# Patient Record
Sex: Female | Born: 1955 | Race: Black or African American | Hispanic: No | Marital: Single | Smoking: Never smoker
Health system: Southern US, Community
[De-identification: ages and names within clinical notes are randomized; demographics above are authoritative.]

## PROBLEM LIST (undated history)

## (undated) DIAGNOSIS — H547 Unspecified visual loss: Secondary | ICD-10-CM

## (undated) DIAGNOSIS — I10 Essential (primary) hypertension: Secondary | ICD-10-CM

## (undated) DIAGNOSIS — K219 Gastro-esophageal reflux disease without esophagitis: Secondary | ICD-10-CM

## (undated) HISTORY — PX: SMALL INTESTINE SURGERY: SHX150

## (undated) HISTORY — DX: Unspecified visual loss: H54.7

## (undated) HISTORY — PX: THYROIDECTOMY: SHX17

## (undated) HISTORY — DX: Gastro-esophageal reflux disease without esophagitis: K21.9

## (undated) HISTORY — DX: Essential (primary) hypertension: I10

---

## 2019-03-30 HISTORY — PX: STOMACH SURGERY: SHX791

## 2020-05-29 ENCOUNTER — Other Ambulatory Visit: Payer: Self-pay

## 2020-05-29 ENCOUNTER — Encounter (HOSPITAL_COMMUNITY): Payer: Self-pay | Admitting: *Deleted

## 2020-05-29 ENCOUNTER — Ambulatory Visit (HOSPITAL_COMMUNITY)
Admission: EM | Admit: 2020-05-29 | Discharge: 2020-05-29 | Disposition: A | Discharge status: Home / Self Care | Payer: Medicaid Other | Attending: Emergency Medicine | Admitting: Emergency Medicine

## 2020-05-29 DIAGNOSIS — I10 Essential (primary) hypertension: Secondary | ICD-10-CM | POA: Diagnosis not present

## 2020-05-29 LAB — CBC WITH DIFFERENTIAL/PLATELET
Abs Immature Granulocytes: 0.01 10*3/uL (ref 0.00–0.07)
Basophils Absolute: 0 10*3/uL (ref 0.0–0.1)
Basophils Relative: 1 %
Eosinophils Absolute: 0 10*3/uL (ref 0.0–0.5)
Eosinophils Relative: 1 %
HCT: 34.4 % — ABNORMAL LOW (ref 36.0–46.0)
Hemoglobin: 11.1 g/dL — ABNORMAL LOW (ref 12.0–15.0)
Immature Granulocytes: 0 %
Lymphocytes Relative: 40 %
Lymphs Abs: 1.6 10*3/uL (ref 0.7–4.0)
MCH: 27.7 pg (ref 26.0–34.0)
MCHC: 32.3 g/dL (ref 30.0–36.0)
MCV: 85.8 fL (ref 80.0–100.0)
Monocytes Absolute: 0.4 10*3/uL (ref 0.1–1.0)
Monocytes Relative: 11 %
Neutro Abs: 1.9 10*3/uL (ref 1.7–7.7)
Neutrophils Relative %: 47 %
Platelets: 190 10*3/uL (ref 150–400)
RBC: 4.01 MIL/uL (ref 3.87–5.11)
RDW: 13.9 % (ref 11.5–15.5)
WBC: 3.9 10*3/uL — ABNORMAL LOW (ref 4.0–10.5)
nRBC: 0 % (ref 0.0–0.2)

## 2020-05-29 LAB — COMPREHENSIVE METABOLIC PANEL
ALT: 10 U/L (ref 0–44)
AST: 22 U/L (ref 15–41)
Albumin: 3.5 g/dL (ref 3.5–5.0)
Alkaline Phosphatase: 89 U/L (ref 38–126)
Anion gap: 11 (ref 5–15)
BUN: 6 mg/dL — ABNORMAL LOW (ref 8–23)
CO2: 25 mmol/L (ref 22–32)
Calcium: 9.3 mg/dL (ref 8.9–10.3)
Chloride: 106 mmol/L (ref 98–111)
Creatinine, Ser: 0.64 mg/dL (ref 0.44–1.00)
GFR, Estimated: >60 mL/min (ref >60)
Glucose, Bld: 89 mg/dL (ref 70–99)
Potassium: 4.1 mmol/L (ref 3.5–5.1)
Sodium: 142 mmol/L (ref 135–145)
Total Bilirubin: 1.1 mg/dL (ref 0.3–1.2)
Total Protein: 6.9 g/dL (ref 6.5–8.1)

## 2020-05-29 MED ORDER — HYDROCHLOROTHIAZIDE 25 MG PO TABS: 25.0000 mg | ORAL_TABLET | Freq: Every day | ORAL | 0 refills | Status: DC

## 2020-05-29 NOTE — Congregational Nurse Program (Signed)
Client came in to establish care with Congregational nurse. New arrival from [place] as refugee. Initial blood pressure 214/95 HR 105. Repeat after 5 minutes 201/103 and 191/115 after resting for 30 minutes.  She is also complaining of vision problems.Not taking any medication at this time.Client speaks Arabic and interpreter was used. A referral to [facility] Urgent care done and Benedetto Goad ride scheduled with [employer]. Arman Bogus RN BSN PCCN  [facility] [phone]-cell [phone]-office

## 2020-05-29 NOTE — ED Provider Notes (Signed)
____________________________________________  Time seen: Approximately 2:56 PM  I have reviewed the triage vital signs and the nursing notes.   HISTORY  Chief Complaint Hypertension   Historian Patient    HPI Emily Newman is a 64 y.o. female presents to the urgent care referred from the congressional nurse program for high blood pressure.  Patient's blood pressure was 167/103 at triage.  Patient has had no other symptoms.  She denies headache, chest pain, chest tightness or abdominal pain.  Patient is accompanied with a family member who translates for patient.  No other alleviating measures have been attempted.  Patient has never been diagnosed with hypertension in the past.   History reviewed. No pertinent past medical history.   Immunizations up to date:  Yes.     History reviewed. No pertinent past medical history.  There are no problems to display for this patient.   History reviewed. No pertinent surgical history.  Prior to Admission medications   Medication Sig Start Date End Date Taking? Authorizing Provider  hydrochlorothiazide (HYDRODIURIL) 25 MG tablet Take 1 tablet (25 mg total) by mouth daily. 05/29/20 06/28/20  Orvil Feil, PA-C    Allergies Patient has no known allergies.  History reviewed. No pertinent family history.  Social History Social History   Tobacco Use  . Smoking status: Never Smoker  . Smokeless tobacco: Never Used  Substance Use Topics  . Alcohol use: Never  . Drug use: Never     Review of Systems  Constitutional: No fever/chills Eyes:  No discharge ENT: No upper respiratory complaints. Respiratory: no cough. No SOB/ use of accessory muscles to breath Gastrointestinal:   No nausea, no vomiting.  No diarrhea.  No constipation. Musculoskeletal: Negative for musculoskeletal pain. Skin: Negative for rash, abrasions, lacerations, ecchymosis.   ____________________________________________   PHYSICAL EXAM:  VITAL  SIGNS: ED Triage Vitals  Enc Vitals Group     BP 05/29/20 1415 (!) 167/103     Pulse Rate 05/29/20 1415 96     Resp 05/29/20 1415 15     Temp 05/29/20 1415 99.4 F (37.4 C)     Temp Source 05/29/20 1415 Oral     SpO2 05/29/20 1415 98 %     Weight --      Height --      Head Circumference --      Peak Flow --      Pain Score 05/29/20 1453 2     Pain Loc --      Pain Edu? --      Excl. in GC? --      Constitutional: Alert and oriented. Well appearing and in no acute distress. Eyes: Conjunctivae are normal. PERRL. EOMI. Head: Atraumatic. ENT:      Nose: No congestion/rhinnorhea.      Mouth/Throat: Mucous membranes are moist.  Neck: No stridor.  No cervical spine tenderness to palpation. Cardiovascular: Normal rate, regular rhythm. Normal S1 and S2.  Good peripheral circulation. Respiratory: Normal respiratory effort without tachypnea or retractions. Lungs CTAB. Good air entry to the bases with no decreased or absent breath sounds Gastrointestinal: Bowel sounds x 4 quadrants. Soft and nontender to palpation. No guarding or rigidity. No distention. Musculoskeletal: Full range of motion to all extremities. No obvious deformities noted Neurologic:  Normal for age. No gross focal neurologic deficits are appreciated.  Skin:  Skin is warm, dry and intact. No rash noted. Psychiatric: Mood and affect are normal for age. Speech and behavior are normal.   ____________________________________________  LABS (all labs ordered are listed, but only abnormal results are displayed)  Labs Reviewed  CBC WITH DIFFERENTIAL/PLATELET  COMPREHENSIVE METABOLIC PANEL   ____________________________________________  EKG   ____________________________________________  RADIOLOGY  No results found.  ____________________________________________    PROCEDURES  Procedure(s) performed:     Procedures     Medications - No data to  display   ____________________________________________   INITIAL IMPRESSION / ASSESSMENT AND PLAN / ED COURSE  Pertinent labs & imaging results that were available during my care of the patient were reviewed by me and considered in my medical decision making (see chart for details).      Assessment and plan Hypertension 64 year old female presents to the urgent care with concern for hypertension.  Patient was hypertensive at triage but vital signs were otherwise reassuring.  Explained to family member that patient needed to establish care with a primary care provider.  I provided a referral a primary care practice during this urgent care encounter.  A CBC and CMP were obtained and patient was started on hydrochlorothiazide.  Patient was contacted by phone and notified that CBC and CMP were reassuring and that she could start hydrochlorothiazide once daily.  Recommended have blood pressured rechecked in 2 to 3 weeks.   ____________________________________________  FINAL CLINICAL IMPRESSION(S) / ED DIAGNOSES  Final diagnoses:  Hypertension, unspecified type      NEW MEDICATIONS STARTED DURING THIS VISIT:  ED Discharge Orders         Ordered    hydrochlorothiazide (HYDRODIURIL) 25 MG tablet  Daily        05/29/20 1449              This chart was dictated using voice recognition software/Dragon. Despite best efforts to proofread, errors can occur which can change the meaning. Any change was purely unintentional.     Orvil Feil, PA-C 05/29/20 1636

## 2020-05-29 NOTE — ED Triage Notes (Signed)
Pt sent from Congregational Nurse Program for HIGH PB. Pt new arrival from [place]. Pt has been in the [place] for 3 weeks. Blood pressure high during initial screening with congregational nurse. BP today. 214/95 - HR 105  201/103 - HR 103  191/115 - HR 103.  Pt is not on any HTN medications.

## 2020-05-29 NOTE — ED Notes (Addendum)
Call daughter [phone] for test results

## 2020-05-29 NOTE — Discharge Instructions (Signed)
Please wait to hear from you by phone before starting medication. If your labs are appropriate, you will take hydrochlorothiazide once daily for the next month and then will reassess blood pressure. Please establish care with a primary care provider.

## 2020-06-05 NOTE — Congregational Nurse Program (Signed)
  Dept: [phone]   Congregational Nurse Program Note  Date of Encounter: 06/05/2020  Past Medical History: No past medical history on file.  Encounter Details:  CNP Questionnaire - 06/05/20 1231      Questionnaire   Do you give verbal consent to treat you today? Yes    Visit Setting Church or Organization    Location Patient Served At [facility]    Patient Status Refugee    Insurance Medicaid    Intervention Advocate;Assess (including screenings);Counsel;Educate;Support    Housing/Utilities Worried about losing Nurse, adult Within past 12 months, worried food would run out with no money to buy more    Referrals PCP - [facility]    ED Visit Averted Yes         Client came in for blood pressure check after ED Visit.Today Blood pressure reading 157/89 HR 91. She is taking medication as advised. Client was referred to [facility] at [facility]. I have scheduled appointment as new patient for December 22 @10 : Loyed Wilmes RN BSN PCCN  [facility] [phone]- cell

## 2020-06-18 ENCOUNTER — Ambulatory Visit (INDEPENDENT_AMBULATORY_CARE_PROVIDER_SITE_OTHER): Payer: Medicaid Other | Admitting: Family

## 2020-06-18 ENCOUNTER — Encounter: Payer: Self-pay | Admitting: Family

## 2020-06-18 ENCOUNTER — Other Ambulatory Visit: Payer: Self-pay

## 2020-06-18 VITALS — BP 153/96 | HR 83 | Ht 62.52 in | Wt 138.8 lb

## 2020-06-18 DIAGNOSIS — Z789 Other specified health status: Secondary | ICD-10-CM

## 2020-06-18 DIAGNOSIS — Z1211 Encounter for screening for malignant neoplasm of colon: Secondary | ICD-10-CM

## 2020-06-18 DIAGNOSIS — Z7689 Persons encountering health services in other specified circumstances: Secondary | ICD-10-CM

## 2020-06-18 DIAGNOSIS — Z2821 Immunization not carried out because of patient refusal: Secondary | ICD-10-CM

## 2020-06-18 DIAGNOSIS — K0889 Other specified disorders of teeth and supporting structures: Secondary | ICD-10-CM

## 2020-06-18 DIAGNOSIS — K219 Gastro-esophageal reflux disease without esophagitis: Secondary | ICD-10-CM | POA: Diagnosis not present

## 2020-06-18 DIAGNOSIS — I10 Essential (primary) hypertension: Secondary | ICD-10-CM | POA: Diagnosis not present

## 2020-06-18 MED ORDER — OMEPRAZOLE 40 MG PO CPDR: 40.0000 mg | DELAYED_RELEASE_CAPSULE | Freq: Every day | ORAL | 0 refills | Status: DC

## 2020-06-18 MED ORDER — HYDROCHLOROTHIAZIDE 25 MG PO TABS: 25.0000 mg | ORAL_TABLET | Freq: Every day | ORAL | 0 refills | Status: DC

## 2020-06-18 NOTE — Progress Notes (Signed)
Patient ID: Emily Newman, female    DOB: 1956-06-08  MRN: [id number]  CC: Establish Care  Subjective: Emily Newman is a 64 y.o. female who presents to establish care.  Current issues and/or concerns: 1. URGENT CARE FOLLOW UP: 05/29/2020: Patient presented to urgent care with concern for hypertension. Patient was hypertensive at triage but vital signs were otherwise reassuring. Explained to patient that she needed to establish care with a primary care provider. A referral provided to primary care practice. CBC and CMP were obtained and patient started on Hydrochlorothiazide. Patient was contacted by phone and notified that CBC and CMP were reassuring and that she could start hydrochlorothiazide once daily. Recommended to have blood pressure rechecked in 2 to 3 weeks.   06/18/2020: Time since discharge: 20 days Hospital/facility: [facility] at [facility]  Diagnosis: hypertension Procedures/tests: CMP, CBC Consultants: none New medications: Hydrochlorothiazide Discharge instructions: return precautions discussed and follow-up with PCP  Status: stable  2. HYPERTENSION FOLLOW-UP: Currently taking: see medication list Have you taken your blood pressure medication today: []  Yes [x]  No  Med Adherence: [x]  Yes    []  No Medication side effects: []  Yes    [x]  No Adherence with salt restriction: [x]  Yes    []  No Exercise: Yes []  No [x]  Home Monitoring?: []  Yes    [x]  No Smoking []  Yes [x]  No SOB? []  Yes    [x]  No Chest Pain?: []  Yes    [x]  No Leg swelling?: []  Yes    [x]  No Headaches?: [x]  Yes, from wisdom teeth  Dizziness? []  Yes    [x]  No  3. HEARTBURN: Reports had abdominal related surgery done around October 2020 in . Can not elaborate on what the procedure was for. Since then having some heartburn which she thinks may be related. Taking over-the-counter Omeprazole 20 mg 1 tablet per day and helps some but would like more relief.  Duration:  chronic Onset:  gradual Radiation: stomach and back Episode duration: all day  Heartburn frequency: daily Alleviatiating factors: Omeprazole Aggravating factors: sour food, soda, milk  Status: worse  Dysphagia: no Odynophagia:  no Nausea: no Vomiting: no Hematemesis: no Blood in stool: no  4. TOOTHACHE: Right upper tooth causing pain. Says a piece of the tooth chipped off and the rest of the tooth is still in the gum and causing discomfort. Would like referral to dentist.  There are no problems to display for this patient.    Current Outpatient Medications on File Prior to Visit  Medication Sig Dispense Refill  . hydrochlorothiazide (HYDRODIURIL) 25 MG tablet Take 1 tablet (25 mg total) by mouth daily. 30 tablet 0   No current facility-administered medications on file prior to visit.    No Known Allergies  Social History   Socioeconomic History  . Marital status: Married    Spouse name: Not on file  . Number of children: Not on file  . Years of education: Not on file  . Highest education level: Not on file  Occupational History  . Not on file  Tobacco Use  . Smoking status: Never Smoker  . Smokeless tobacco: Never Used  Substance and Sexual Activity  . Alcohol use: Never  . Drug use: Never  . Sexual activity: Not Currently  Other Topics Concern  . Not on file  Social History Narrative  . Not on file   Social Determinants of Health   Financial Resource Strain: Not on file  Food Insecurity: Not on file  Transportation  Needs: Not on file  Physical Activity: Not on file  Stress: Not on file  Social Connections: Not on file  Intimate Partner Violence: Not on file    No family history on file.  Past Surgical History:  Procedure Laterality Date  . STOMACH SURGERY  03/2019   completed in [place]     ROS: Review of Systems Negative except as stated above  PHYSICAL EXAM: BP (!) 153/96 (BP Location: Left Arm, Patient Position: Sitting)   Pulse 83   Ht 5' 2.52" (1.588  m)   Wt 138 lb 12.8 oz (63 kg)   SpO2 98%   BMI 24.97 kg/m   Physical Exam Constitutional:      Appearance: Normal appearance.  Eyes:     Extraocular Movements: Extraocular movements intact.     Pupils: Pupils are equal, round, and reactive to light.  Cardiovascular:     Rate and Rhythm: Normal rate and regular rhythm.     Pulses: Normal pulses.     Heart sounds: Normal heart sounds.  Pulmonary:     Effort: Pulmonary effort is normal.     Breath sounds: Normal breath sounds.  Neurological:     General: No focal deficit present.     Mental Status: She is alert and oriented to person, place, and time.  Psychiatric:        Mood and Affect: Mood normal.        Behavior: Behavior normal.    ASSESSMENT AND PLAN: 1. Encounter to establish care: - Patient presents today to establish care.  - Return in 4 to 6 weeks or sooner if needed for annual physical examination, labs, and health maintenance.   2. Essential hypertension: - Blood pressure not at goal during today's visit. Patient asymptomatic without chest pressure, chest pain, palpitations, and shortness of breath. - Patient has not taken blood pressure medication for today yet.  - Continue Hydrochlorothiazide as prescribed. - Follow-up with in 2weeks with clinical pharmacist for blood pressure check. Write down your blood pressure readings each day and bring those results along with your home blood pressure monitor to your appointment. Medications may be adjusted at that time if needed. Counseled patient to take blood pressure medication prior to visit. - Counseled on blood pressure goal of less than 130/80, low-sodium, DASH diet, medication compliance, 150 minutes of moderate intensity exercise per week as tolerated. Discussed medication compliance, adverse effects. - CMP last obtained 05/29/2020. - CBC last obtained 05/29/2020. - Follow-up with primary provider as scheduled. - hydrochlorothiazide (HYDRODIURIL) 25 MG tablet; Take  1 tablet (25 mg total) by mouth daily.  Dispense: 90 tablet; Refill: 0  3. Gastroesophageal reflux disease, unspecified whether esophagitis present: - Patient previously taking over-the-counter Omeprazole 20 mg once daily.  - Increase Omeprazole to 40 mg daily.  - Follow-up with primary provider in 3 months or sooner if needed. - omeprazole (PRILOSEC) 40 MG capsule; Take 1 capsule (40 mg total) by mouth daily.  Dispense: 90 capsule; Refill: 0  4. Toothache: - Patient with right upper tooth pain.  - Per patient request referral to Dentistry for further evaluation and management. - Ambulatory referral to Dentistry  5. Colon cancer screening: - Referral to Gastroenterology for colon cancer screening by colonoscopy.  - Ambulatory referral to Gastroenterology  6. Influenza vaccine refused: - Flu vaccine declined during today's visit.  7. Language barrier: - Patient accompanied by [facility] interpreter. Interpreter name: Bedor. Also, accompanied by family member Adaw who serves as part historian and  part interpreter.    Patient was given the opportunity to ask questions.  Patient verbalized understanding of the plan and was able to repeat key elements of the plan. Patient was given clear instructions to go to Emergency Department or return to medical center if symptoms don't improve, worsen, or new problems develop.The patient verbalized understanding.   Orders Placed This Encounter  Procedures  . Ambulatory referral to Dentistry  . Ambulatory referral to Gastroenterology     Requested Prescriptions   Signed Prescriptions Disp Refills  . omeprazole (PRILOSEC) 40 MG capsule 90 capsule 0    Sig: Take 1 capsule (40 mg total) by mouth daily.    Return in about 6 weeks (around 07/30/2020) for Ricky Stabs, NP and 2 weeks follow-up with Franky Macho .  Rema Fendt, NP

## 2020-06-18 NOTE — Progress Notes (Signed)
Establish care Hospitalization f/u Blurry vision

## 2020-06-18 NOTE — Patient Instructions (Signed)
Return in 4 to 6 weeks or sooner if needed for annual physical examination, labs, and health maintenance.   Continue Hydrochlorothiazide for high blood pressure. Refill on Hydrochlorothiazide. Follow-up in 2 weeks for blood pressure check at the [facility] located at [address] [place], [place], [place] [address].  Referral to Dentistry.  Referral to Gastroenterology.   Omeprazole for acid reflux.  Flu vaccine today.   Tetanus vaccine today. Hypertension, Adult Hypertension is another name for high blood pressure. High blood pressure forces your heart to work harder to pump blood. This can cause problems over time. There are two numbers in a blood pressure reading. There is a top number (systolic) over a bottom number (diastolic). It is best to have a blood pressure that is below 120/80. Healthy choices can help lower your blood pressure, or you may need medicine to help lower it. What are the causes? The cause of this condition is not known. Some conditions may be related to high blood pressure. What increases the risk?  Smoking.  Having type 2 diabetes mellitus, high cholesterol, or both.  Not getting enough exercise or physical activity.  Being overweight.  Having too much fat, sugar, calories, or salt (sodium) in your diet.  Drinking too much alcohol.  Having long-term (chronic) kidney disease.  Having a family history of high blood pressure.  Age. Risk increases with age.  Race. You may be at higher risk if you are African American.  Gender. Men are at higher risk than women before age 50. After age 81, women are at higher risk than men.  Having obstructive sleep apnea.  Stress. What are the signs or symptoms?  High blood pressure may not cause symptoms. Very high blood pressure (hypertensive crisis) may cause: ? Headache. ? Feelings of worry or nervousness (anxiety). ? Shortness of breath. ? Nosebleed. ? A feeling of being sick to your  stomach (nausea). ? Throwing up (vomiting). ? Changes in how you see. ? Very bad chest pain. ? Seizures. How is this treated?  This condition is treated by making healthy lifestyle changes, such as: ? Eating healthy foods. ? Exercising more. ? Drinking less alcohol.  Your health care provider may prescribe medicine if lifestyle changes are not enough to get your blood pressure under control, and if: ? Your top number is above 130. ? Your bottom number is above 80.  Your personal target blood pressure may vary. Follow these instructions at home: Eating and drinking   If told, follow the DASH eating plan. To follow this plan: ? Fill one half of your plate at each meal with fruits and vegetables. ? Fill one fourth of your plate at each meal with whole grains. Whole grains include whole-wheat pasta, brown rice, and whole-grain bread. ? Eat or drink low-fat dairy products, such as skim milk or low-fat yogurt. ? Fill one fourth of your plate at each meal with low-fat (lean) proteins. Low-fat proteins include fish, chicken without skin, eggs, beans, and tofu. ? Avoid fatty meat, cured and processed meat, or chicken with skin. ? Avoid pre-made or processed food.  Eat less than 1,500 mg of salt each day.  Do not drink alcohol if: ? Your doctor tells you not to drink. ? You are pregnant, may be pregnant, or are planning to become pregnant.  If you drink alcohol: ? Limit how much you use to:  0-1 drink a day for women.  0-2 drinks a day for men. ? Be  aware of how much alcohol is in your drink. In the [place] one drink equals one 12 oz bottle of beer (355 mL), one 5 oz glass of wine (148 mL), or one 1 oz glass of hard liquor (44 mL). Lifestyle   Work with your doctor to stay at a healthy weight or to lose weight. Ask your doctor what the best weight is for you.  Get at least 30 minutes of exercise most days of the week. This may include walking, swimming, or biking.  Get at least  30 minutes of exercise that strengthens your muscles (resistance exercise) at least 3 days a week. This may include lifting weights or doing Pilates.  Do not use any products that contain nicotine or tobacco, such as cigarettes, e-cigarettes, and chewing tobacco. If you need help quitting, ask your doctor.  Check your blood pressure at home as told by your doctor.  Keep all follow-up visits as told by your doctor. This is important. Medicines  Take over-the-counter and prescription medicines only as told by your doctor. Follow directions carefully.  Do not skip doses of blood pressure medicine. The medicine does not work as well if you skip doses. Skipping doses also puts you at risk for problems.  Ask your doctor about side effects or reactions to medicines that you should watch for. Contact a doctor if you:  Think you are having a reaction to the medicine you are taking.  Have headaches that keep coming back (recurring).  Feel dizzy.  Have swelling in your ankles.  Have trouble with your vision. Get help right away if you:  Get a very bad headache.  Start to feel mixed up (confused).  Feel weak or numb.  Feel faint.  Have very bad pain in your: ? Chest. ? Belly (abdomen).  Throw up more than once.  Have trouble breathing. Summary  Hypertension is another name for high blood pressure.  High blood pressure forces your heart to work harder to pump blood.  For most people, a normal blood pressure is less than 120/80.  Making healthy choices can help lower blood pressure. If your blood pressure does not get lower with healthy choices, you may need to take medicine. This information is not intended to replace advice given to you by your health care provider. Make sure you discuss any questions you have with your health care provider. Document Revised: 02/23/2018 Document Reviewed: 02/23/2018 Elsevier Patient Education  2020 [employer].

## 2020-06-19 ENCOUNTER — Inpatient Hospital Stay: Payer: Medicaid Other | Admitting: Family

## 2020-07-02 NOTE — Progress Notes (Unsigned)
   S:     PCP:   Patient arrives in good spirits. Presents to the clinic for hypertension evaluation, counseling, and management.  Patient was referred and established care with Primary Care Provider on 06/18/20. At that visit, BP was elevated at 153/96 however pt did not take HCTZ prior to visit. No changes made to HTN medications.  Today, patient reports ***  Compliance? Took meds this morning? When do you take your meds? Dizziness, headaches, blurred vision? History of swelling? Check Clinic BP? Home BP logs? If no logs, bring to next visit w/ BP cuff Go over BP goals Additional BP therapy if needed . Amlodipine 2.5 mg vs 5mg   Diet??  Exercise??    Medication adherence *** .  Current BP Medications include:  HCTZ 25 mg daily  Antihypertensives tried in the past include: none  Dietary habits include: ***  Exercise habits include:***  Family / Social history: *** -Fhx: none -Tobacco: denies  ASCVD risk factors include: none identified   O:   Home BP readings: ***  Last 3 Office BP readings: BP Readings from Last 3 Encounters:  06/18/20 (!) 153/96  06/05/20 (!) 157/89  05/29/20 (!) 167/103    BMET    Component Value Date/Time   NA 142 05/29/2020 1435   K 4.1 05/29/2020 1435   CL 106 05/29/2020 1435   CO2 25 05/29/2020 1435   GLUCOSE 89 05/29/2020 1435   BUN 6 (L) 05/29/2020 1435   CREATININE 0.64 05/29/2020 1435   CALCIUM 9.3 05/29/2020 1435   GFRNONAA >60 05/29/2020 1435    Renal function: CrCl cannot be calculated (Patient's most recent lab result is older than the maximum 21 days allowed.).  Clinical ASCVD: {YES/NO:21197} The ASCVD Risk score 14/06/2019 DC Jr., et al., 2013) failed to calculate for the following reasons:   Cannot find a previous HDL lab   Cannot find a previous total cholesterol lab  PHQ-2 Score: ***   A/P: Hypertension diagnosed *** currently *** on current medications. BP Goal = < *** mmHg. Medication adherence ***.   -{Meds adjust:18428} ***.  -F/u labs ordered - *** -Counseled on lifestyle modifications for blood pressure control including reduced dietary sodium, increased exercise, adequate sleep.  Results reviewed and written information provided.   Total time in face-to-face counseling *** minutes.   F/U Clinic Visit in ***.    2014, PharmD, BCPS PGY2 Ambulatory Care Resident [facility]  Pharmacy

## 2020-07-03 ENCOUNTER — Ambulatory Visit: Payer: Medicaid Other | Admitting: Pharmacist

## 2020-07-16 DIAGNOSIS — Z0289 Encounter for other administrative examinations: Secondary | ICD-10-CM | POA: Insufficient documentation

## 2020-07-17 ENCOUNTER — Ambulatory Visit (INDEPENDENT_AMBULATORY_CARE_PROVIDER_SITE_OTHER): Payer: Medicaid Other | Admitting: Family Medicine

## 2020-07-17 ENCOUNTER — Encounter: Payer: Self-pay | Admitting: Family Medicine

## 2020-07-17 ENCOUNTER — Other Ambulatory Visit: Payer: Self-pay

## 2020-07-17 ENCOUNTER — Encounter: Payer: Self-pay | Admitting: Family

## 2020-07-17 VITALS — BP 132/84 | HR 82 | Ht 65.5 in | Wt 144.8 lb

## 2020-07-17 DIAGNOSIS — E041 Nontoxic single thyroid nodule: Secondary | ICD-10-CM | POA: Diagnosis not present

## 2020-07-17 DIAGNOSIS — R1013 Epigastric pain: Secondary | ICD-10-CM | POA: Diagnosis not present

## 2020-07-17 DIAGNOSIS — R053 Chronic cough: Secondary | ICD-10-CM | POA: Insufficient documentation

## 2020-07-17 DIAGNOSIS — Z0289 Encounter for other administrative examinations: Secondary | ICD-10-CM | POA: Diagnosis not present

## 2020-07-17 DIAGNOSIS — I10 Essential (primary) hypertension: Secondary | ICD-10-CM | POA: Diagnosis not present

## 2020-07-17 DIAGNOSIS — H547 Unspecified visual loss: Secondary | ICD-10-CM | POA: Diagnosis not present

## 2020-07-17 LAB — POCT URINALYSIS DIP (MANUAL ENTRY)
Bilirubin, UA: NEGATIVE
Glucose, UA: NEGATIVE mg/dL
Ketones, POC UA: NEGATIVE mg/dL
Leukocytes, UA: NEGATIVE
Nitrite, UA: NEGATIVE
Protein Ur, POC: NEGATIVE mg/dL
Spec Grav, UA: 1.01 (ref 1.010–1.025)
Urobilinogen, UA: 0.2 E.U./dL
pH, UA: 5.5 (ref 5.0–8.0)

## 2020-07-17 LAB — POCT UA - MICROSCOPIC ONLY

## 2020-07-17 NOTE — Assessment & Plan Note (Signed)
BP at goal, repeat BMP today. Will continue current therapy. Lipid panel for risk stratification.

## 2020-07-17 NOTE — Patient Instructions (Addendum)
It was wonderful to see you today.  Please bring ALL of your medications with you to every visit.   Today we talked about:   I put a referral into the eye doctor, you will be called with an appointmetn   Dental Care  Call one of the numbers below [facility] Dentist  Address: [address], [place], [place] [address] Phone: [phone]  [facility]  Address: [address] [place], [place], [place] [address] Phone: [phone] Thank you for choosing [facility] Family Medicine.   Please call [phone] with any questions about today's appointment.  Please be sure to schedule follow up at the front  desk before you leave today.   [name], MD  Family Medicine

## 2020-07-17 NOTE — Assessment & Plan Note (Signed)
DDx included adhesive disease from prior surgery, reflux, H. Pylori Will discuss further at follow up--consider H. Pylori breath test although negative result invalid as previously Rx'd PPI  Had appropriate pre departure parasite therapy

## 2020-07-17 NOTE — Assessment & Plan Note (Signed)
Referral to [facility]---ask they call grandson ([name]), number in chart.

## 2020-07-17 NOTE — Progress Notes (Signed)
Patient Name: Emily Newman Date of Birth: [birth year] Date of Visit: 07/17/20 PCP: Martyn Malay, MD  Chief Complaint: refugee intake examination and HTN   The patient's preferred language is Arabic. An interpreter was used for the entire visit.  Interpreter Name or ID: Bedor    Subjective: Emily Newman is a pleasant 65 y.o. presenting today for an initial refugee and immigrant clinic visit.   She is joined today by 4 grandchildren--[name] (22), Akut, Achol, and Sara. (14,11,8). She has been in [place] since September. She is caring for kids, [name] is not yet working (hand issue). Feel safe in home and doing well. Have found foods they live, some she can cook. Knows how to use bus route, usually travels with kids due to vision. No specific concerns today.   HTN Taking medication without issue. No headaches, chest pain, or dyspnea.   Vision Impairment  The patient reports a longstanding issue with vision. She is not sure of cause. She is blind in left eye, right eye is 6/60. Denies pain, drainage, recent changes to vision.   Medication Review Reports a history of abdominal pain and dyspepsia. Weight has been stable. Eating and drinking well. She is taking omeprazole, was not tested for H. Pylori prior to Rx. Denies vomiting, melena, hematochezia.  Dental Pain  Patient reports longstanding history of dental issues. She is brushing teeth, desires dental care.     ROS: Negative for headaches, chest pain, dyspnea, abdominal pain    PMH: HTN L eye blindness R eye impairment Poor dentition History of goiter s/p R thyroidectomy on exam  ? Possible history of SBO  PSH: 'stomach surgery'- appears portion of colon removed in 2021 [place]    [place]: None Had 4 children 10 grandchildren   Allergies:  NKDA    Current Medications:  Omeprazole HCTZ 25 mg    Social History: Tobacco Use: denied Alcohol Use: none   Refugee Information Number of Immediate Family Members: 4 Number of  Immediate Family Members in [place]: 4 Date of Arrival: 03/25/20 Country of Birth: [place] Location of Refugee Camp:  ([place]) Duration in [place]: 20 years or greater Reason for Bucks: Membership in particular social group Primary Language: Arabic Marital Status: Single Sexual Activity: No Tuberculosis Screening Overseas: Negative (but abnormal CXR) Tuberculosis Screening Health Department: Not Completed Health Department Labs Completed: No Do You Feel Jumpy or Nervous?: No Are You Very Watchful or 'Super Alert'?: No   Date of Overseas Exam: 02/21/20  Review of Overseas Exam: Yes Pre-Departure Treatment: yes- Ivermectin, Albendazole,  PZQ Overseas Vaccines Reviewed and Updated in Epic Yes   Vitals:   07/17/20 0916  BP: 132/84  Pulse: 82  SpO2: 97%   HEENT: Sclera anicteric. Dentition is poor . Appears well hydrated. Neck: Supple R thyroectomy scar, large L sided thyroid nodule 2 cm in diameter  Cardiac: Regular rate and rhythm. Normal S1/S2. No murmurs, rubs, or gallops appreciated. Lungs: Clear bilaterally to ascultation.  Abdomen: Normoactive bowel sounds. No tenderness to deep or light palpation. No rebound or guarding. Splenomegaly none appreciated  Extremities: Warm, well perfused without edema.  Skin: Warm, dry, no rashes (children present, not full eam)   Psych: Pleasant and appropriate  MSK: Slow but steady normal gait  Ext without edema    Essential hypertension BP at goal, repeat BMP today. Will continue current therapy. Lipid panel for risk stratification.   Thyroid nodule L thyroid nodule, somewhat irregular - TSH today - Will plan  for ultrasound at follow up   Vision impairment Referral to [place] [facility]---ask they call grandson ([name]), number in chart.   Epigastric pain DDx included adhesive disease from prior surgery, reflux, H. Pylori Will discuss further at follow up--consider H. Pylori breath test although negative result invalid as  previously Rx'd PPI  Had appropriate pre departure parasite therapy   HCM Needs colonoscopy, mammogram, Pap (as never had one), PCV23  Also pending HD labs (has visit tomorrow) will need MMR serologies, varicella serologies   I have personally updated the history tabs within Epic and included the refugee information in social documentation.    Designated Advertising account planner signed with agency yes.   Release of information signed for Health Department yes.   Return to care in 1 month in [facility] with resident physician and PCP scheduled.   Vaccines: NA- HD tomorrow   At follow up Thyroid ultrasound Abdominal symptoms    Dorris Singh, MD  [facility] Medicine Teaching Service

## 2020-07-17 NOTE — Assessment & Plan Note (Signed)
L thyroid nodule, somewhat irregular - TSH today - Will plan for ultrasound at follow up

## 2020-07-19 ENCOUNTER — Telehealth: Payer: Self-pay | Admitting: Family Medicine

## 2020-07-19 NOTE — Telephone Encounter (Signed)
Called with interpreter--patient is asleep.   Will try again next week--K on lower side. Will need to discuss decreasing HCTZ vs. K rich foods.  [name], MD  Family Medicine Teaching Service

## 2020-07-21 LAB — CBC WITH DIFFERENTIAL/PLATELET
Basophils Absolute: 0 10*3/uL (ref 0.0–0.2)
Basos: 1 %
EOS (ABSOLUTE): 0.1 10*3/uL (ref 0.0–0.4)
Eos: 2 %
Hematocrit: 34.5 % (ref 34.0–46.6)
Hemoglobin: 11.1 g/dL (ref 11.1–15.9)
Immature Grans (Abs): 0 10*3/uL (ref 0.0–0.1)
Immature Granulocytes: 0 %
Lymphocytes Absolute: 1.3 10*3/uL (ref 0.7–3.1)
Lymphs: 39 %
MCH: 26.7 pg (ref 26.6–33.0)
MCHC: 32.2 g/dL (ref 31.5–35.7)
MCV: 83 fL (ref 79–97)
Monocytes Absolute: 0.4 10*3/uL (ref 0.1–0.9)
Monocytes: 10 %
Neutrophils Absolute: 1.6 10*3/uL (ref 1.4–7.0)
Neutrophils: 48 %
Platelets: 199 10*3/uL (ref 150–450)
RBC: 4.16 x10E6/uL (ref 3.77–5.28)
RDW: 13.1 % (ref 11.7–15.4)
WBC: 3.4 10*3/uL (ref 3.4–10.8)

## 2020-07-21 LAB — COMPREHENSIVE METABOLIC PANEL
ALT: 10 IU/L (ref 0–32)
AST: 23 IU/L (ref 0–40)
Albumin/Globulin Ratio: 1.3 (ref 1.2–2.2)
Albumin: 4.1 g/dL (ref 3.8–4.8)
Alkaline Phosphatase: 113 IU/L (ref 44–121)
BUN/Creatinine Ratio: 12 (ref 12–28)
BUN: 9 mg/dL (ref 8–27)
Bilirubin Total: 0.4 mg/dL (ref 0.0–1.2)
CO2: 25 mmol/L (ref 20–29)
Calcium: 8.9 mg/dL (ref 8.7–10.3)
Chloride: 101 mmol/L (ref 96–106)
Creatinine, Ser: 0.75 mg/dL (ref 0.57–1.00)
GFR calc Af Amer: 97 mL/min/{1.73_m2} (ref >59)
GFR calc non Af Amer: 84 mL/min/{1.73_m2} (ref >59)
Globulin, Total: 3.2 g/dL (ref 1.5–4.5)
Glucose: 119 mg/dL — ABNORMAL HIGH (ref 65–99)
Potassium: 3.2 mmol/L — ABNORMAL LOW (ref 3.5–5.2)
Sodium: 140 mmol/L (ref 134–144)
Total Protein: 7.3 g/dL (ref 6.0–8.5)

## 2020-07-21 LAB — LIPID PANEL
Chol/HDL Ratio: 4.2 ratio (ref 0.0–4.4)
Cholesterol, Total: 184 mg/dL (ref 100–199)
HDL: 44 mg/dL (ref >39)
LDL Chol Calc (NIH): 111 mg/dL — ABNORMAL HIGH (ref 0–99)
Triglycerides: 166 mg/dL — ABNORMAL HIGH (ref 0–149)
VLDL Cholesterol Cal: 29 mg/dL (ref 5–40)

## 2020-07-21 LAB — HIV 1/2 AB DIFFERENTIATION
HIV 1 Ab: NONREACTIVE
HIV 2 Ab: UNDETERMINED

## 2020-07-21 LAB — VARICELLA ZOSTER ANTIBODY, IGG: Varicella zoster IgG: 1330 index (ref >165)

## 2020-07-21 LAB — HEPATITIS B SURFACE ANTIGEN: Hepatitis B Surface Ag: NEGATIVE

## 2020-07-21 LAB — HIV-1/HIV-2 QUALITATIVE RNA
HIV-1 RNA, Qualitative: NONREACTIVE
HIV-2 RNA, Qualitative: NONREACTIVE

## 2020-07-21 LAB — HEPATITIS B CORE ANTIBODY, TOTAL: Hep B Core Total Ab: POSITIVE — AB

## 2020-07-21 LAB — RPR: RPR Ser Ql: NONREACTIVE

## 2020-07-21 LAB — HCV AB W REFLEX TO QUANT PCR: HCV Ab: <0.1 s/co ratio (ref 0.0–0.9)

## 2020-07-21 LAB — HEPATITIS B SURFACE ANTIBODY, QUANTITATIVE: Hepatitis B Surf Ab Quant: 7.3 m[IU]/mL — ABNORMAL LOW (ref >9.9)

## 2020-07-21 LAB — HCV INTERPRETATION

## 2020-07-21 LAB — TSH: TSH: 0.337 u[IU]/mL — ABNORMAL LOW (ref 0.450–4.500)

## 2020-07-21 LAB — HIV ANTIBODY (ROUTINE TESTING W REFLEX): HIV Screen 4th Generation wRfx: REACTIVE

## 2020-07-22 ENCOUNTER — Telehealth: Payer: Self-pay | Admitting: Family Medicine

## 2020-07-22 NOTE — Telephone Encounter (Signed)
   Varicella immune  Hypokalemia

## 2020-07-22 NOTE — Telephone Encounter (Signed)
Called patient and son with interpreter to review labs.  We reviewed hypokalemia, recommended potassium rich diet repeat in 2 weeks.  She is varicella immune.  CBC is improved from prior.  No eosinophilia.  Hep B exposed and antibody titer is low although I suspect she does have some immunity.  Will need a repeat series will need  TSH suppresse repeat with 4 and T3 at follow-up.  All questions answered reviewed the time and date of upcoming appointment.  [place] Arabic interpreter was used throughout the entire entirety of this telephone encounter.

## 2020-07-24 ENCOUNTER — Other Ambulatory Visit: Payer: Self-pay | Admitting: Family Medicine

## 2020-07-24 ENCOUNTER — Telehealth: Payer: Self-pay | Admitting: Family Medicine

## 2020-07-24 NOTE — Telephone Encounter (Signed)
Called with arabic interpreter about lab showing indeterminate HIV 2.   Discussed, will retest Friday.   Will need to discuss R. Busick re: proper labs per [facility].  [name], MD  [facility] Medicine Teaching Service

## 2020-07-26 ENCOUNTER — Other Ambulatory Visit: Payer: Self-pay

## 2020-07-26 ENCOUNTER — Other Ambulatory Visit: Payer: Self-pay | Admitting: Family Medicine

## 2020-07-26 ENCOUNTER — Other Ambulatory Visit: Payer: Medicaid Other

## 2020-07-26 DIAGNOSIS — Z114 Encounter for screening for human immunodeficiency virus [HIV]: Secondary | ICD-10-CM

## 2020-07-28 LAB — HIV-1/HIV-2 QUALITATIVE RNA
HIV-1 RNA, Qualitative: NONREACTIVE
HIV-2 RNA, Qualitative: NONREACTIVE

## 2020-07-30 NOTE — Progress Notes (Signed)
Patient did not show for appointment. °

## 2020-07-31 ENCOUNTER — Encounter: Payer: Medicaid Other | Admitting: Family

## 2020-07-31 DIAGNOSIS — Z0000 Encounter for general adult medical examination without abnormal findings: Secondary | ICD-10-CM

## 2020-07-31 DIAGNOSIS — Z131 Encounter for screening for diabetes mellitus: Secondary | ICD-10-CM

## 2020-07-31 DIAGNOSIS — Z13820 Encounter for screening for osteoporosis: Secondary | ICD-10-CM

## 2020-07-31 DIAGNOSIS — Z13220 Encounter for screening for lipoid disorders: Secondary | ICD-10-CM

## 2020-07-31 DIAGNOSIS — Z1211 Encounter for screening for malignant neoplasm of colon: Secondary | ICD-10-CM

## 2020-07-31 DIAGNOSIS — Z124 Encounter for screening for malignant neoplasm of cervix: Secondary | ICD-10-CM

## 2020-07-31 DIAGNOSIS — Z1231 Encounter for screening mammogram for malignant neoplasm of breast: Secondary | ICD-10-CM

## 2020-08-01 ENCOUNTER — Other Ambulatory Visit: Payer: Self-pay | Admitting: Family Medicine

## 2020-08-01 ENCOUNTER — Telehealth: Payer: Self-pay | Admitting: Family Medicine

## 2020-08-01 LAB — HIV-1/HIV-2 QUALITATIVE RNA
HIV-1 RNA, Qualitative: NONREACTIVE
HIV-2 RNA, Qualitative: NONREACTIVE

## 2020-08-01 LAB — HIV 1/2 AB DIFFERENTIATION
HIV 1 Ab: NONREACTIVE
HIV 2 Ab: UNDETERMINED

## 2020-08-01 LAB — HIV ANTIBODY (ROUTINE TESTING W REFLEX): HIV Screen 4th Generation wRfx: REACTIVE

## 2020-08-01 NOTE — Telephone Encounter (Signed)
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 Patient speaks only [place] and thus communicated through grandson with patient's permission. Repeat HIV2 quant RNA to [facility] at follow up.   [name], MD  Family Medicine Teaching Service

## 2020-08-05 ENCOUNTER — Other Ambulatory Visit: Payer: Self-pay | Admitting: Family Medicine

## 2020-08-05 DIAGNOSIS — R75 Inconclusive laboratory evidence of human immunodeficiency virus [HIV]: Secondary | ICD-10-CM

## 2020-08-05 NOTE — Addendum Note (Signed)
Addended by: Manson Passey, Kailer Heindel on: 08/05/2020 03:49 PM   Modules accepted: Orders

## 2020-08-05 NOTE — Progress Notes (Signed)
Called [facility] [place] [facility] [employer] top tube with at least 3-4 mL of PLASMA sent frozen on dry ice  HIV2VL [address]  [place] [address]   Ordered as future lab for collection.  [name], MD  Family Medicine Teaching Service

## 2020-08-12 ENCOUNTER — Ambulatory Visit (INDEPENDENT_AMBULATORY_CARE_PROVIDER_SITE_OTHER): Payer: Medicaid Other | Admitting: Family Medicine

## 2020-08-12 ENCOUNTER — Encounter: Payer: Self-pay | Admitting: Family Medicine

## 2020-08-12 ENCOUNTER — Other Ambulatory Visit: Payer: Self-pay | Admitting: Family Medicine

## 2020-08-12 ENCOUNTER — Other Ambulatory Visit: Payer: Self-pay

## 2020-08-12 VITALS — BP 146/82 | HR 92 | Ht 66.0 in | Wt 144.0 lb

## 2020-08-12 DIAGNOSIS — E041 Nontoxic single thyroid nodule: Secondary | ICD-10-CM

## 2020-08-12 DIAGNOSIS — E0590 Thyrotoxicosis, unspecified without thyrotoxic crisis or storm: Secondary | ICD-10-CM

## 2020-08-12 DIAGNOSIS — I10 Essential (primary) hypertension: Secondary | ICD-10-CM | POA: Diagnosis not present

## 2020-08-12 DIAGNOSIS — E876 Hypokalemia: Secondary | ICD-10-CM

## 2020-08-12 DIAGNOSIS — H547 Unspecified visual loss: Secondary | ICD-10-CM

## 2020-08-12 DIAGNOSIS — R1013 Epigastric pain: Secondary | ICD-10-CM

## 2020-08-12 DIAGNOSIS — Z659 Problem related to unspecified psychosocial circumstances: Secondary | ICD-10-CM

## 2020-08-12 MED ORDER — AMLODIPINE BESYLATE 5 MG PO TABS: 5.0000 mg | ORAL_TABLET | Freq: Every day | ORAL | 3 refills | Status: DC
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
## 2020-08-12 NOTE — Assessment & Plan Note (Signed)
Number given for [facility]--referral previously placed.

## 2020-08-12 NOTE — Patient Instructions (Addendum)
It was wonderful to see you today.  Please bring ALL of your medications with you to every visit.   Today we talked about:  Starting a new medication for your blood pressure---please pick this up today   Call [facility] [phone]    We are scheduling you ultrasound    Thank you for choosing [facility] Family Medicine.   Please call [phone] with any questions about today's appointment.  Please be sure to schedule follow up at the front  desk before you leave today.   [name], MD  Family Medicine

## 2020-08-12 NOTE — Assessment & Plan Note (Signed)
Improved---will hold off on refilling PPI and transition to H2 blocker in future.

## 2020-08-12 NOTE — Assessment & Plan Note (Signed)
Repeat TSH (depressed last visit) with T4/T3. Ordered and scheduled ultrasound. Perhaps toxic multinodular goiter, hyperfunctioning nodule, no adenopathy on exam but with age must consider malignancy.

## 2020-08-12 NOTE — Assessment & Plan Note (Signed)
Given side effect of hypokalemia, discontinue HCTZ. Start Norvasc. Not at goal, consider losartan at follow up.

## 2020-08-13 ENCOUNTER — Encounter: Payer: Self-pay | Admitting: Family Medicine

## 2020-08-13 LAB — BASIC METABOLIC PANEL
BUN/Creatinine Ratio: 23 (ref 12–28)
BUN: 19 mg/dL (ref 8–27)
CO2: 24 mmol/L (ref 20–29)
Calcium: 9.2 mg/dL (ref 8.7–10.3)
Chloride: 102 mmol/L (ref 96–106)
Creatinine, Ser: 0.83 mg/dL (ref 0.57–1.00)
GFR calc Af Amer: 86 mL/min/{1.73_m2} (ref >59)
GFR calc non Af Amer: 74 mL/min/{1.73_m2} (ref >59)
Glucose: 101 mg/dL — ABNORMAL HIGH (ref 65–99)
Potassium: 3.8 mmol/L (ref 3.5–5.2)
Sodium: 140 mmol/L (ref 134–144)

## 2020-08-13 LAB — TSH: TSH: 0.548 u[IU]/mL (ref 0.450–4.500)

## 2020-08-13 LAB — T4, FREE: Free T4: 1.21 ng/dL (ref 0.82–1.77)

## 2020-08-13 LAB — T3: T3, Total: 105 ng/dL (ref 71–180)

## 2020-08-19 ENCOUNTER — Ambulatory Visit (HOSPITAL_COMMUNITY): Payer: Medicaid Other

## 2020-08-22 ENCOUNTER — Telehealth: Payer: Self-pay | Admitting: Family Medicine

## 2020-08-22 DIAGNOSIS — H539 Unspecified visual disturbance: Secondary | ICD-10-CM

## 2020-08-22 NOTE — Telephone Encounter (Signed)
Refaxed referral that was faxed on 07/19/20. Jone Baseman, CMA

## 2020-08-22 NOTE — Telephone Encounter (Signed)
New referral to [facility] placed.

## 2020-08-23 ENCOUNTER — Telehealth: Payer: Self-pay | Admitting: Family Medicine

## 2020-08-23 NOTE — Telephone Encounter (Signed)
   Telephone encounter was:  Unsuccessful.  08/23/2020 Name: Emily Newman MRN: [id number] DOB: 11-Sep-1955  Unsuccessful outbound call made today to assist with:  Housing Assistance  Outreach Attempt:  1st Attempt  A HIPAA compliant voice message was left requesting a return call.  Instructed patient to call back at [phone]. (Used Landscape architect to call patient)  Danelle Berry Care Guide, Embedded Care Coordination [facility], Care Management Phone: [phone] Email: [email or web address]

## 2020-08-27 ENCOUNTER — Ambulatory Visit (HOSPITAL_COMMUNITY): Payer: Medicaid Other

## 2020-09-03 ENCOUNTER — Telehealth: Payer: Self-pay | Admitting: Family Medicine

## 2020-09-03 NOTE — Telephone Encounter (Signed)
   Telephone encounter was:  Unsuccessful.  09/03/2020 Name: Emily Newman MRN: [id number] DOB: 1956-05-01  Unsuccessful outbound call made today to assist with:  Housing Assistance  Outreach Attempt:  2nd Attempt  Attempted to call patient, but phone is not accepting calls at this time, could not leave a message. Care Guide will give patient a call back within the week.  [facility] Care Guide, Embedded Care Coordination [facility], Care Management Phone: [phone] Email: [email or web address]

## 2020-09-04 ENCOUNTER — Ambulatory Visit (HOSPITAL_COMMUNITY)
Admission: RE | Admit: 2020-09-04 | Discharge: 2020-09-04 | Disposition: A | Discharge status: Home / Self Care | Payer: Medicaid Other | Source: Ambulatory Visit | Attending: Family Medicine | Admitting: Family Medicine

## 2020-09-04 ENCOUNTER — Other Ambulatory Visit: Payer: Self-pay

## 2020-09-04 DIAGNOSIS — E041 Nontoxic single thyroid nodule: Secondary | ICD-10-CM | POA: Insufficient documentation

## 2020-09-04 DIAGNOSIS — E0590 Thyrotoxicosis, unspecified without thyrotoxic crisis or storm: Secondary | ICD-10-CM | POA: Insufficient documentation

## 2020-09-05 ENCOUNTER — Telehealth: Payer: Self-pay | Admitting: Family Medicine

## 2020-09-05 NOTE — Telephone Encounter (Signed)
   Telephone encounter was:  Successful.  09/05/2020 Name: Radley Teston MRN: [id number] DOB: 1955-12-07  Aretta Plamondon is a 65 y.o. year old female who is a primary care patient of Westley Chandler, MD . The community resource team was consulted for assistance with Housing  Care guide performed the following interventions: Spoke with Ms. Rueger today regarding referral. Used [employer] to call patient. Patient stated that she is very grateful for the call because it's been hard trying to get in touch with her sponsor [place]. Patient stated that she also has questions regarding her food stamp card and she would like housing information sent to her in the mail. Informed patient that Care Guide will try to contact [facility] DSS to get her caseworker information and also send her a listing of income based housing. .  Follow Up Plan:  Care guide will follow up with patient by phone over the next week  [facility] Guide, Embedded Care Coordination [facility], Care Management Phone: [phone] Email: [email or web address]

## 2020-09-10 NOTE — Congregational Nurse Program (Signed)
  Dept: [phone]   Congregational Nurse Program Note  Date of Encounter: 09/10/2020  Past Medical History: Past Medical History:  Diagnosis Date  . GERD (gastroesophageal reflux disease)   . Hypertension   . Vision impairment     Encounter Details: Client came in for blood pressure check.She reports taking blood pressure medication as advised. She will be back repeat blood pressure check in a week. She also came in for reading glasses.Reading glasses provided upon request.   Arman Bogus RN BSn [facility]  [facility] Congregational Nurse [phone]-cell [phone]-office

## 2020-09-16 ENCOUNTER — Telehealth: Payer: Self-pay | Admitting: Family Medicine

## 2020-09-16 NOTE — Telephone Encounter (Signed)
Called lab, HIV 2 RNA negative. They will fax results.   Emily Starr, MD  Family Medicine Teaching Service

## 2020-09-17 ENCOUNTER — Ambulatory Visit (INDEPENDENT_AMBULATORY_CARE_PROVIDER_SITE_OTHER): Payer: Medicaid Other | Admitting: Family Medicine

## 2020-09-17 ENCOUNTER — Encounter: Payer: Self-pay | Admitting: Family Medicine

## 2020-09-17 ENCOUNTER — Other Ambulatory Visit: Payer: Self-pay

## 2020-09-17 VITALS — BP 150/84 | HR 88 | Wt 150.8 lb

## 2020-09-17 DIAGNOSIS — R1013 Epigastric pain: Secondary | ICD-10-CM | POA: Diagnosis not present

## 2020-09-17 DIAGNOSIS — M170 Bilateral primary osteoarthritis of knee: Secondary | ICD-10-CM | POA: Diagnosis not present

## 2020-09-17 DIAGNOSIS — I10 Essential (primary) hypertension: Secondary | ICD-10-CM

## 2020-09-17 DIAGNOSIS — E041 Nontoxic single thyroid nodule: Secondary | ICD-10-CM

## 2020-09-17 DIAGNOSIS — Z23 Encounter for immunization: Secondary | ICD-10-CM | POA: Diagnosis not present

## 2020-09-17 LAB — SPECIMEN STATUS REPORT

## 2020-09-17 LAB — OTHER LAB TEST

## 2020-09-17 MED ORDER — DICLOFENAC SODIUM 1.5 % EX SOLN: CUTANEOUS | 3 refills | Status: DC

## 2020-09-17 NOTE — Assessment & Plan Note (Signed)
Discussed at length.  I would like to test her for H. pylori in the future she will have to be off proton pump inhibitor therapy for at least a month.  She will finish these 2 pills and we will not refill this medication.  She is to call if her symptoms worsen.

## 2020-09-17 NOTE — Assessment & Plan Note (Signed)
Repeat exam in March 2023 discussed with patient and her caregiver today.

## 2020-09-17 NOTE — Progress Notes (Signed)
SUBJECTIVE:   CHIEF COMPLAINT / HPI:   The patient speaks [place] Arabic (and [place]) as their primary language.  An interpreter was used for the entire visit.   Emily Newman is a pleasant 65 year old woman with history significant for hypertension and HIV2 indeterminate status.   She presents today with her niece (Emily Newman).  Their major concern is the patient's housing.  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  Her oldest grandson is been drinking alcohol and they would like to move to different housing.  Her referral was previously placed to chronic care management however they have not heard from them.  The patient has difficulty answering her phone as she speaks [place] rather than [place] Arabic.  They are requesting that the social worker contact Emily Newman.   HIV2 lab returned negative at [facility].  The patient is taking her antihypertensive.  She is also taking the rest of the proton pump inhibitor.  She thinks that if she stops taking this her symptoms of reflux will return.  She denies weight loss, melena or hematochezia.  The patient reports intermittent knee and low back pain. Present for many years. She has crepitus. No falls, redness, swelling.   PERTINENT  PMH / PSH/Family/Social History : updated and reviewed  OBJECTIVE:   BP (!) 150/84   Pulse 88   Wt 150 lb 12.8 oz (68.4 kg)   SpO2 98%   BMI 24.34 kg/m   Today's weight:  Last Weight  Most recent update: 09/17/2020  8:58 AM   Weight  68.4 kg (150 lb 12.8 oz)           Review of prior weights: [employer]   09/17/20 0858  Weight: 150 lb 12.8 oz (68.4 kg)     Cardiac: Regular rate and rhythm. Normal S1/S2. No murmurs, rubs, or gallops appreciated. Lungs: Clear bilaterally to ascultation.  Abdomen: Normoactive bowel sounds. No tenderness to deep or light palpation. No rebound or guarding. No epigastric pain  Psych: Pleasant and appropriate  Mild TTP along R medial joint line + crepitus,  good ROM   Thyroid Ultrasound  IMPRESSION: 1.1 cm left inferior TR 4 nodule with macrocalcifications meets criteria for follow-up in 1 year.  Remote right thyroidectomy  Nonspecific background left thyroid heterogeneity suggesting chronic medical thyroid disease.  ASSESSMENT/PLAN:   Essential hypertension Near goal she is taking her medication as prescribed.  Consider increasing to 10 mg versus adding a low-dose of losartan in the future.  Epigastric pain Discussed at length.  I would like to test her for H. pylori in the future she will have to be off proton pump inhibitor therapy for at least a month.  She will finish these 2 pills and we will not refill this medication.  She is to call if her symptoms worsen.  Thyroid nodule Repeat exam in March 2023 discussed with patient and her caregiver today.   Mild bilateral knee pain, suspect OA, less likely meniscal pathology. Trial of NSAID gel (given HTN), consider Xray at follow up.    Social Tana Felts will message chronic care management team and greatly appreciate their support in finding this patient safe housing given her age and social stressors.  Healthcare maintenance Started discussion of Pap smear with patient today will complete at follow-up as she has never had one in past.   Refer to gastroenterology at follow-up for consideration of colonoscopy. Received Hep A, B, and Tdap today.  At follow up PCV20.  Emily Newman, [place]

## 2020-09-17 NOTE — Assessment & Plan Note (Signed)
Near goal she is taking her medication as prescribed.  Consider increasing to 10 mg versus adding a low-dose of losartan in the future.

## 2020-09-17 NOTE — Patient Instructions (Addendum)
It was wonderful to see you today.  Please bring ALL of your medications with you to every visit.   Today we talked about:  [facility]  [address] [place], [place] [address] 01 October 2020 10:10 AM  [phone]   Thank you for choosing [facility] Family Medicine.   Please call [phone] with any questions about today's appointment.  Please be sure to schedule follow up at the front  desk before you leave today.   [name], MD  Family Medicine

## 2020-09-18 ENCOUNTER — Telehealth: Payer: Self-pay | Admitting: Family Medicine

## 2020-09-18 MED ORDER — DICLOFENAC SODIUM 1 % EX GEL: CUTANEOUS | 1 refills | Status: DC

## 2020-09-18 NOTE — Telephone Encounter (Signed)
Alternative voltaren to pharmacy.   [name], MD  Family Medicine Teaching Service

## 2020-09-18 NOTE — Telephone Encounter (Signed)
   Telephone encounter was:  Unsuccessful.  09/18/2020 Name: Emily Newman MRN: [id number] DOB: 09/05/1955  Unsuccessful outbound call made today to assist with:  Housing Assistance  Outreach Attempt:  3rd Attempt.  Referral closed unable to contact patient.  Attempted to contact patient regarding referral. Used [employer], but no Counselling psychologist available at this time. Patient has already been sent resources for housing within the [place] area.   [facility] Care Guide, Embedded Care Coordination [facility], Care Management Phone: [phone] Email: [email or web address]

## 2020-09-21 ENCOUNTER — Other Ambulatory Visit: Payer: Self-pay | Admitting: Family

## 2020-09-21 DIAGNOSIS — K219 Gastro-esophageal reflux disease without esophagitis: Secondary | ICD-10-CM

## 2020-09-23 NOTE — Telephone Encounter (Signed)
Please refill your patients heart burn medication if the request is appropriate.

## 2020-10-01 ENCOUNTER — Other Ambulatory Visit: Payer: Self-pay | Admitting: Family

## 2020-10-01 DIAGNOSIS — K219 Gastro-esophageal reflux disease without esophagitis: Secondary | ICD-10-CM

## 2020-10-29 NOTE — Congregational Nurse Program (Signed)
  Dept: [phone]   Congregational Nurse Program Note  Date of Encounter: 10/29/2020  Past Medical History: Past Medical History:  Diagnosis Date  . GERD (gastroesophageal reflux disease)   . Hypertension   . Vision impairment     Encounter Details:  Patient is requesting assistance to call pharmacy and refill medications. She has been without blood pressure medications for about 2 weeks. Pharmacy contacted and medication refilled. Patient educated on compliance and instructed to ask for assistance before medications ran out.She verbalized understanding.    Arman Bogus RN BSn [facility]  [facility] Congregational Nurse [phone]-cell [phone]-office

## 2020-11-01 ENCOUNTER — Ambulatory Visit: Payer: Medicaid Other | Admitting: Family Medicine

## 2020-12-11 NOTE — Congregational Nurse Program (Signed)
  Dept: [phone]   Congregational Nurse Program Note  Date of Encounter: 12/11/2020  Past Medical History: Past Medical History:  Diagnosis Date   GERD (gastroesophageal reflux disease)    Hypertension    Vision impairment     Encounter Details:  Emily Newman came in for blood pressure check. She is compliant with medication. Inspected her medication ans she has enough for the next 2 weeks. She will be back for blood pressure check and assistance with refill  Arman Bogus RN BSn [facility]  [facility] Congregational Nurse [phone]-cell [phone]-office

## 2020-12-23 ENCOUNTER — Other Ambulatory Visit: Payer: Self-pay | Admitting: Family Medicine

## 2020-12-23 ENCOUNTER — Emergency Department (HOSPITAL_COMMUNITY)
Admission: EM | Admit: 2020-12-23 | Discharge: 2020-12-23 | Disposition: A | Discharge status: Home / Self Care | Payer: Medicaid Other | Attending: Emergency Medicine | Admitting: Emergency Medicine

## 2020-12-23 ENCOUNTER — Emergency Department (HOSPITAL_COMMUNITY): Payer: Medicaid Other

## 2020-12-23 DIAGNOSIS — I10 Essential (primary) hypertension: Secondary | ICD-10-CM | POA: Insufficient documentation

## 2020-12-23 DIAGNOSIS — S0990XA Unspecified injury of head, initial encounter: Secondary | ICD-10-CM | POA: Diagnosis present

## 2020-12-23 DIAGNOSIS — Z59819 Housing instability, housed unspecified: Secondary | ICD-10-CM

## 2020-12-23 DIAGNOSIS — Z79899 Other long term (current) drug therapy: Secondary | ICD-10-CM | POA: Insufficient documentation

## 2020-12-23 DIAGNOSIS — Z20822 Contact with and (suspected) exposure to covid-19: Secondary | ICD-10-CM | POA: Diagnosis not present

## 2020-12-23 DIAGNOSIS — R791 Abnormal coagulation profile: Secondary | ICD-10-CM | POA: Insufficient documentation

## 2020-12-23 DIAGNOSIS — Y900 Blood alcohol level of less than 20 mg/100 ml: Secondary | ICD-10-CM | POA: Diagnosis not present

## 2020-12-23 DIAGNOSIS — T1490XA Injury, unspecified, initial encounter: Secondary | ICD-10-CM

## 2020-12-23 LAB — CBC
HCT: 34.7 % — ABNORMAL LOW (ref 36.0–46.0)
Hemoglobin: 11.1 g/dL — ABNORMAL LOW (ref 12.0–15.0)
MCH: 26.2 pg (ref 26.0–34.0)
MCHC: 32 g/dL (ref 30.0–36.0)
MCV: 81.8 fL (ref 80.0–100.0)
Platelets: 167 10*3/uL (ref 150–400)
RBC: 4.24 MIL/uL (ref 3.87–5.11)
RDW: 14.9 % (ref 11.5–15.5)
WBC: 4.1 10*3/uL (ref 4.0–10.5)
nRBC: 0 % (ref 0.0–0.2)

## 2020-12-23 LAB — COMPREHENSIVE METABOLIC PANEL
ALT: 12 U/L (ref 0–44)
AST: 21 U/L (ref 15–41)
Albumin: 3.3 g/dL — ABNORMAL LOW (ref 3.5–5.0)
Alkaline Phosphatase: 85 U/L (ref 38–126)
Anion gap: 9 (ref 5–15)
BUN: 13 mg/dL (ref 8–23)
CO2: 24 mmol/L (ref 22–32)
Calcium: 8.8 mg/dL — ABNORMAL LOW (ref 8.9–10.3)
Chloride: 106 mmol/L (ref 98–111)
Creatinine, Ser: 0.77 mg/dL (ref 0.44–1.00)
GFR, Estimated: >60 mL/min (ref >60)
Glucose, Bld: 130 mg/dL — ABNORMAL HIGH (ref 70–99)
Potassium: 3 mmol/L — ABNORMAL LOW (ref 3.5–5.1)
Sodium: 139 mmol/L (ref 135–145)
Total Bilirubin: 0.6 mg/dL (ref 0.3–1.2)
Total Protein: 7.4 g/dL (ref 6.5–8.1)

## 2020-12-23 LAB — RESP PANEL BY RT-PCR (FLU A&B, COVID) ARPGX2
Influenza A by PCR: NEGATIVE
Influenza B by PCR: NEGATIVE
SARS Coronavirus 2 by RT PCR: NEGATIVE

## 2020-12-23 LAB — PROTIME-INR
INR: 1 (ref 0.8–1.2)
Prothrombin Time: 13 seconds (ref 11.4–15.2)

## 2020-12-23 LAB — ETHANOL: Alcohol, Ethyl (B): <10 mg/dL (ref <10)

## 2020-12-23 LAB — SAMPLE TO BLOOD BANK

## 2020-12-23 MED ORDER — ACETAMINOPHEN 325 MG PO TABS: 650.0000 mg | ORAL_TABLET | ORAL | Status: DC | PRN

## 2020-12-23 MED ORDER — OLANZAPINE 5 MG PO TBDP: 10.0000 mg | ORAL_TABLET | Freq: Three times a day (TID) | ORAL | Status: DC | PRN

## 2020-12-23 MED ORDER — ONDANSETRON HCL 4 MG PO TABS: 4.0000 mg | ORAL_TABLET | Freq: Three times a day (TID) | ORAL | Status: DC | PRN

## 2020-12-23 MED ORDER — ZIPRASIDONE MESYLATE 20 MG IM SOLR: 20.0000 mg | INTRAMUSCULAR | Status: DC | PRN

## 2020-12-23 MED ORDER — LORAZEPAM 1 MG PO TABS: 1.0000 mg | ORAL_TABLET | ORAL | Status: DC | PRN

## 2020-12-23 NOTE — ED Notes (Signed)
Pt's case worker from the community came to see pt. Pt will be able to be safely discharged with caseworker.

## 2020-12-23 NOTE — ED Notes (Addendum)
Note created on wrong pt.
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
## 2020-12-23 NOTE — BH Assessment (Signed)
Secure chat message sent requesting TTS psych consult set up.

## 2020-12-23 NOTE — ED Notes (Signed)
Family is at bedside at this time. Pt's eyes are not open and she is following simple commands

## 2020-12-23 NOTE — Discharge Instructions (Signed)
???? ??????? ???????               [address] , ?????????, ????????? ???????? [id number]  ???? ?????? ??? ????? ????? ???????.  [phone]

## 2020-12-23 NOTE — ED Notes (Signed)
CT reports approx 15 more minutes until pt goes to CT.
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
## 2020-12-23 NOTE — ED Notes (Signed)
Daughter-in-law called back and reported that no one that she has called will be able to come to the hospital at this time. She stated that she and the son do not have transportation. Encouraged her to use the bus system to come or to continue try to find a way here. Daughter-in-law reports that she will continue to try.

## 2020-12-23 NOTE — ED Notes (Signed)
Contacted family member (daughter-in-law) who spoke with Dr Stevie Kern and this nurse. Encouraged a family member that is trusted by pt to come be with pt at the hospital. Family will consider who is coming. Gave number for family member to call nurse for this pt.
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
## 2020-12-23 NOTE — Progress Notes (Signed)
Called and spoke to niece. She spoke with patient today---patient back at home from ED and safe.Discussed CCM referral for LCSW and follow up re: housing insecurity. Referral placed.   Will message CM Doha (release on file to communicate with her).   [name], MD  Family Medicine Teaching Service

## 2020-12-23 NOTE — Progress Notes (Addendum)
CSW was informed at shift change patient may need assistance with transportation home after TTS evaluation is completed. CSW left taxi voucher at main nurses station for pick up when needed.  CSW spoke with [name], who states she is the patient's case Production designer, theatre/television/film from a nonprofit organization in [place]. Doha states the patient is not in any immediate danger nor is she a risk of harm to herself or anyone else. [name] will provide patient with transportation home.  CSW spoke with at [facility] disposition CSW who states Shavon Rankin, NP will document that there is no need for a TTS evaluation. Dr. Lockie Mola aware and will discharge patient.  Edwin Dada, MSW, LCSW Transitions of Care  Clinical Social Worker II                                                        [phone]

## 2020-12-23 NOTE — ED Notes (Signed)
having difficulty coordinating the [facility] and the TTS at the same time, when 1 is available the other is not

## 2020-12-24 ENCOUNTER — Telehealth: Payer: Self-pay | Admitting: Family Medicine

## 2020-12-24 DIAGNOSIS — I10 Essential (primary) hypertension: Secondary | ICD-10-CM

## 2020-12-24 MED ORDER — AMLODIPINE BESYLATE 5 MG PO TABS: 5.0000 mg | ORAL_TABLET | Freq: Every day | ORAL | 3 refills | Status: DC

## 2020-12-24 NOTE — Telephone Encounter (Addendum)
Called patient's case Production designer, theatre/television/film [name]). Discussed referrals from ED [facility] services).   Emily Newman will call to schedule intake etc. Patient has had difficulty with BP medications--refill to pharmacy. Referral to CCM RN to help with medications and follow up appointments.   Will route to Congregational Nurses for additional recommendations regarding help for family.   [name], MD  Family Medicine Teaching Service
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
## 2020-12-26 ENCOUNTER — Other Ambulatory Visit: Payer: Self-pay

## 2020-12-26 NOTE — Patient Outreach (Signed)
  Medicaid Managed Care Social Work Note  12/26/2020 Name:  Emily Newman MRN:  [id number] DOB:  Jul 12, 1955  Emily Newman is an 65 y.o. year old female who is a primary patient of Westley Chandler, MD.  The Medicaid Managed Care Coordination team was consulted for assistance with:   housing  Emily Newman was given information about Medicaid Managed CareCoordination services today. Emily Newman agreed to services and verbal consent obtained.  Engaged with patient  for by telephone forinitial visit in response to referral for case management and/or care coordination services.   Assessments/Interventions:  Review of past medical history, allergies, medications, health status, including review of consultants reports, laboratory and other test data, was performed as part of comprehensive evaluation and provision of chronic care management services.  SDOH: (Social Determinant of Health) assessments and interventions performed:  BSW contacted patient and left a voicemail BSW also contacted Emily Newman. She states that patient is doing better now that Emily Newman is no longer in the home. Patient would like to get a section 8 voucher. Patient is not currently working right now. BSW will speak with Emily CM Emily Newman to get some additional information.   Advanced Directives Status:  Not addressed in this encounter.  Care Plan                 No Known Allergies  Medications Reviewed Today     Reviewed by Westley Chandler, MD (Physician) on 09/17/20 at 1400  Med List Status: <None>   Medication Order Taking? Sig Documenting Provider Last Dose Status Informant  amLODipine (NORVASC) 5 MG tablet [id number] Yes Take 1 tablet (5 mg total) by mouth at bedtime. Westley Chandler, MD Taking Active   Diclofenac Sodium 1.5 % SOLN [id number] Yes Apply four times daily Westley Chandler, MD  Active   omeprazole (PRILOSEC) 40 MG capsule [id number] Yes Take 1 capsule (40 mg total) by mouth daily. Rema Fendt, NP Taking Active              Patient Active Problem List   Diagnosis Date Noted   Essential hypertension 07/17/2020   Epigastric pain 07/17/2020   Vision impairment 07/17/2020   Thyroid nodule 07/17/2020   Refugee health examination 07/16/2020    Conditions to be addressed/monitored per PCP order:   housing  There are no care plans that you recently modified to display for this patient.   Follow up:  Patient agrees to Care Plan and Follow-up.  Plan: The Managed Medicaid care management team will reach out to the patient again over the next 14 days.  Date/time of next scheduled Social Work care management/care coordination outreach:  patient has an appointment with LCSW on 01/01/21  Gus Puma, BSW, [facility]  [facility]  High Risk Managed Medicaid Team  [phone]

## 2020-12-26 NOTE — Patient Instructions (Signed)
Visit Information  Emily Newman was given information about Medicaid Managed Care team care coordination services as a part of their [facility] Community Plan Medicaid benefit. [name] verbally consented to engagement with the [facility] Managed Care team.   For questions related to your [facility], please call: [phone] or visit the homepage here: [email or web address]  If you would like to schedule transportation through your [facility], please call the following number at least 2 days in advance of your appointment: [phone].   Call the Behavioral Health Crisis Line at [phone], at any time, 24 hours a day, 7 days a week. If you are in danger or need immediate medical attention call 911.  [name] - following are the goals we discussed in your visit today:   Goals Addressed   None      Social Worker will follow up with patient's CM.  Gus Puma, BSW, [place] [facility]  [place]  High Risk Managed Medicaid Team  [phone]   Following is a copy of your plan of care:  There are no care plans to display for this patient.

## 2021-01-01 ENCOUNTER — Other Ambulatory Visit: Payer: Self-pay

## 2021-01-01 NOTE — Patient Instructions (Signed)
Emily Newman ,   The Medicaid Managed Care Team is available to provide assistance to you with your healthcare needs at no cost and as a benefit of your Medicaid Health plan. Please reach out to me at the number below. I am available to be of assistance to you regarding your healthcare needs. .   Thank you,   Tanveer Brammer, BSW, MSW, LCSW Managed Medicaid LCSW [place]  [facility] [email or web address] Phone: [phone]   

## 2021-01-01 NOTE — Patient Outreach (Signed)
  [facility]) Care Management  [facility] Social Work  01/01/2021  Emily Newman Nov 19, 1955 [id number]  Encounter Medications:  Outpatient Encounter Medications as of 01/01/2021  Medication Sig   amLODipine (NORVASC) 5 MG tablet Take 1 tablet (5 mg total) by mouth at bedtime.   diclofenac Sodium (VOLTAREN) 1 % GEL Apply four times daily to knee (Patient taking differently: Apply 2-4 g topically 4 (four) times daily. Apply to  knee)   omeprazole (PRILOSEC) 40 MG capsule Take 1 capsule (40 mg total) by mouth daily.   No facility-administered encounter medications on file as of 01/01/2021.    Functional Status:  In your present state of health, do you have any difficulty performing the following activities: 06/18/2020  Hearing? N  Vision? Y  Difficulty concentrating or making decisions? N  Walking or climbing stairs? Y  Dressing or bathing? N  Doing errands, shopping? N    Fall/Depression Screening:  PHQ 2/9 Scores 09/17/2020 06/18/2020  PHQ - 2 Score 1 2  PHQ- 9 Score 1 3    LCSW completed [facility] outreach attempt today but there were no dinka interpreters available today but was able to reach a [place] Arabic  interpreter to make first outreach. There was no option to leave a voice mail. LCSW will reschedule patient's [facility] Social Work appointment if no return call has been made.    Plan:  Follow-up:  Follow-up in 2-3 week(s)   Dickie La, BSW, MSW, [employer] Managed Medicaid LCSW [facility]  [facility] [email or web address] Phone: [phone]

## 2021-01-14 ENCOUNTER — Ambulatory Visit: Payer: Self-pay

## 2021-01-14 ENCOUNTER — Telehealth: Payer: Self-pay | Admitting: Licensed Clinical Social Worker

## 2021-01-14 NOTE — Patient Instructions (Signed)
Chidera Hitchner ,   The Medicaid Managed Care Team is available to provide assistance to you with your healthcare needs at no cost and as a benefit of your [facility] Health plan. Please reach out to me at the number below. I am available to be of assistance to you regarding your healthcare needs. .   Thank you,   Dickie La, BSW, MSW, LCSW Managed Medicaid LCSW [facility]  [email or web address] Phone: [phone]

## 2021-01-14 NOTE — Patient Outreach (Signed)
  [facility]) Care Management  [facility] Social Work  01/14/2021  Emily Newman May 05, 1956 [id number]  Encounter Medications:  Outpatient Encounter Medications as of 01/14/2021  Medication Sig   amLODipine (NORVASC) 5 MG tablet Take 1 tablet (5 mg total) by mouth at bedtime.   diclofenac Sodium (VOLTAREN) 1 % GEL Apply four times daily to knee (Patient taking differently: Apply 2-4 g topically 4 (four) times daily. Apply to  knee)   omeprazole (PRILOSEC) 40 MG capsule Take 1 capsule (40 mg total) by mouth daily.   No facility-administered encounter medications on file as of 01/14/2021.    Functional Status:  In your present state of health, do you have any difficulty performing the following activities: 06/18/2020  Hearing? N  Vision? Y  Difficulty concentrating or making decisions? N  Walking or climbing stairs? Y  Dressing or bathing? N  Doing errands, shopping? N    Fall/Depression Screening:  PHQ 2/9 Scores 09/17/2020 06/18/2020  PHQ - 2 Score 1 2  PHQ- 9 Score 1 3    Assessment:  Care Plan There are no care plans that you recently modified to display for this patient.    Goals Addressed   None    LCSW completed [facility] second outreach attempt today but there were no dinka interpreters available today. However, [facility] LCSW was able to find a [place] Arabic  interpreter to make second outreach.  LCSW will reschedule patient's [facility] Social Work appointment if no return call has been made.  A HIPPA compliant phone message was left for the patient providing contact information and requesting a return call.   Dickie La, BSW, MSW, [employer] Managed Medicaid LCSW [facility]  [facility] [email or web address] Phone: [phone]

## 2021-01-15 NOTE — Congregational Nurse Program (Signed)
  Dept: [phone]   Congregational Nurse Program Note  Date of Encounter: 01/15/2021  Past Medical History: Past Medical History:  Diagnosis Date   GERD (gastroesophageal reflux disease)    Hypertension    Vision impairment     Encounter Details:   Patient requested blood pressure check. Pressure 142/79 at this time.  Regino Schultze, RN Congregational Nursing

## 2021-01-28 ENCOUNTER — Ambulatory Visit: Payer: Self-pay

## 2021-01-28 ENCOUNTER — Telehealth: Payer: Self-pay | Admitting: Licensed Clinical Social Worker

## 2021-01-28 NOTE — Patient Outreach (Signed)
  [facility]) Care Management  [facility] Social Work  01/28/2021  Seraiah Burgener [birth year] [id number]   Encounter Medications:  Outpatient Encounter Medications as of 01/28/2021  Medication Sig   amLODipine (NORVASC) 5 MG tablet Take 1 tablet (5 mg total) by mouth at bedtime.   diclofenac Sodium (VOLTAREN) 1 % GEL Apply four times daily to knee (Patient taking differently: Apply 2-4 g topically 4 (four) times daily. Apply to  knee)   omeprazole (PRILOSEC) 40 MG capsule Take 1 capsule (40 mg total) by mouth daily.   No facility-administered encounter medications on file as of 01/28/2021.    Functional Status:  In your present state of health, do you have any difficulty performing the following activities: 06/18/2020  Hearing? N  Vision? Y  Difficulty concentrating or making decisions? N  Walking or climbing stairs? Y  Dressing or bathing? N  Doing errands, shopping? N    Fall/Depression Screening:  PHQ 2/9 Scores 09/17/2020 06/18/2020  PHQ - 2 Score 1 2  PHQ- 9 Score 1 3    Assessment:  Care Plan There are no care plans that you recently modified to display for this patient.    Goals Addressed   None   LCSW completed [facility] outreach attempt today but was unable to reach patient successfully. A HIPPA compliant voice message was left encouraging patient to return call once available. LCSW will reschedule patient's [facility] Social Work appointment to 02/05/21 if no return call has been made.  Dickie La, BSW, MSW, [employer] Managed Medicaid LCSW [facility]  [facility] [email or web address] Phone: [phone]

## 2021-02-03 ENCOUNTER — Emergency Department (HOSPITAL_COMMUNITY)
Admission: EM | Admit: 2021-02-03 | Discharge: 2021-02-03 | Disposition: A | Discharge status: Home / Self Care | Payer: Medicaid Other | Attending: Emergency Medicine | Admitting: Emergency Medicine

## 2021-02-03 ENCOUNTER — Emergency Department (HOSPITAL_COMMUNITY): Payer: Medicaid Other

## 2021-02-03 DIAGNOSIS — Z79899 Other long term (current) drug therapy: Secondary | ICD-10-CM | POA: Insufficient documentation

## 2021-02-03 DIAGNOSIS — S61214A Laceration without foreign body of right ring finger without damage to nail, initial encounter: Secondary | ICD-10-CM

## 2021-02-03 DIAGNOSIS — W268XXA Contact with other sharp object(s), not elsewhere classified, initial encounter: Secondary | ICD-10-CM | POA: Insufficient documentation

## 2021-02-03 DIAGNOSIS — Z23 Encounter for immunization: Secondary | ICD-10-CM | POA: Diagnosis not present

## 2021-02-03 DIAGNOSIS — S61216A Laceration without foreign body of right little finger without damage to nail, initial encounter: Secondary | ICD-10-CM | POA: Insufficient documentation

## 2021-02-03 DIAGNOSIS — Y9389 Activity, other specified: Secondary | ICD-10-CM | POA: Insufficient documentation

## 2021-02-03 DIAGNOSIS — I10 Essential (primary) hypertension: Secondary | ICD-10-CM | POA: Diagnosis not present

## 2021-02-03 DIAGNOSIS — S6991XA Unspecified injury of right wrist, hand and finger(s), initial encounter: Secondary | ICD-10-CM | POA: Diagnosis present

## 2021-02-03 MED ORDER — TETANUS-DIPHTH-ACELL PERTUSSIS 5-2.5-18.5 LF-MCG/0.5 IM SUSY
0.5000 mL | PREFILLED_SYRINGE | Freq: Once | INTRAMUSCULAR | Status: AC
  Administered 2021-02-03: 0.5 mL via INTRAMUSCULAR
  Filled 2021-02-03: qty 0.5

## 2021-02-03 MED ORDER — LIDOCAINE HCL (PF) 1 % IJ SOLN
5.0000 mL | Freq: Once | INTRAMUSCULAR | Status: DC
  Filled 2021-02-03: qty 5

## 2021-02-03 NOTE — ED Provider Notes (Signed)
Emergency Medicine Provider Triage Evaluation Note  Emily Newman , a 65 y.o. female  was evaluated in triage.  Pt complains of laceration to R 5th digit that occurred about 1 hour ago. Pt was washing dishes when she cut her hand from a broken dish. Bleeding controlled at this time. Tetanus status unknown. Grandson with patient currently.  Review of Systems  Positive: + laceration to pinky finger Negative: - numbness  Physical Exam  BP (!) 142/113   Pulse 92   Temp 98.7 F (37.1 C) (Oral)   Resp 12   SpO2 99%  Gen:   Awake, no distress   Resp:  Normal effort  MSK:   Moves extremities without difficulty  Other:  2 cm laceration to middle phalanx of right 5th finger, mild bleeding at this time. 2+ radial pulse. Cap refill < 2 seconds.  Medical Decision Making  Medically screening exam initiated at 11:17 AM.  Appropriate orders placed.  Emily Newman was informed that the remainder of the evaluation will be completed by another provider, this initial triage assessment does not replace that evaluation, and the importance of remaining in the ED until their evaluation is complete.  Laceration to right 5th finger. Tetanus updated today. Xray ordered. Will require sutures. There was toilet paper wrapped over wound; attempted to remove in triage with NS flush however still mild debris to finger. Will likely need to soak hand prior to sutures.    Tanda Rockers, PA-C 02/03/21 1121    Gloris Manchester, MD 02/03/21 1950

## 2021-02-03 NOTE — Discharge Instructions (Addendum)
Please follow up with hand surgeon Dr. Melvyn Novas for further evaluation of your finger laceration. He can removed the sutures there. Otherwise the sutures will need to be removed in 10 days time.  While at home you can take Ibuprofen and Tylenol as needed for pain  Return to the ED for any signs of infection including redness/swelling around the wound, drainage of pus, fevers > 100.4, or any other new/concerning symptoms

## 2021-02-03 NOTE — ED Notes (Signed)
Lidocaine at bedside EDP aware.

## 2021-02-03 NOTE — ED Triage Notes (Signed)
Pt here POV with c/o of right finger pain. Pt was washing dishes and cut self. Bleeding controlled, finger wrapped. 10/10 pain

## 2021-02-03 NOTE — ED Provider Notes (Signed)
[facility] EMERGENCY DEPARTMENT Provider Note   CSN: [id number] Arrival date & time: 02/03/21  1101     History Chief Complaint  Patient presents with   Finger Injury    Emily Newman is a 65 y.o. female with PMHx HTN and GERD who presents to the ED today with complaint of finger injury.  Patient was washing dishes when she accidentally sliced her hand on a broken dish in the sink.  Bleeding controlled at time of ED visit.  Tetanus status unknown.  Complains of pain to the area, no other complaints at this time.  Denies any numbness/tingling to the finger.  The history is provided by the patient, medical records and a relative. The history is limited by a language barrier. A language interpreter was used.      Past Medical History:  Diagnosis Date   GERD (gastroesophageal reflux disease)    Hypertension    Vision impairment     Patient Active Problem List   Diagnosis Date Noted   Essential hypertension 07/17/2020   Epigastric pain 07/17/2020   Vision impairment 07/17/2020   Thyroid nodule 07/17/2020   Refugee health examination 07/16/2020    Past Surgical History:  Procedure Laterality Date   SMALL INTESTINE SURGERY     ? SBO in [place] and had bowel resection    STOMACH SURGERY  03/2019   completed in [place]    THYROIDECTOMY     R lobe by exam      OB History   No obstetric history on file.     No family history on file.  Social History   Tobacco Use   Smoking status: Never   Smokeless tobacco: Never  Substance Use Topics   Alcohol use: Never   Drug use: Never    Home Medications Prior to Admission medications   Medication Sig Start Date End Date Taking? Authorizing Provider  amLODipine (NORVASC) 5 MG tablet Take 1 tablet (5 mg total) by mouth at bedtime. 12/24/20   Westley Chandler, MD  diclofenac Sodium (VOLTAREN) 1 % GEL Apply four times daily to knee Patient taking differently: Apply 2-4 g topically 4 (four) times daily. Apply to   knee 09/18/20   Westley Chandler, MD  omeprazole (PRILOSEC) 40 MG capsule Take 1 capsule (40 mg total) by mouth daily. 06/18/20   Rema Fendt, NP    Allergies    Patient has no known allergies.  Review of Systems   Review of Systems  Constitutional:  Negative for fever.  Musculoskeletal:  Positive for arthralgias.  Skin:  Positive for wound.  Neurological:  Negative for numbness.  All other systems reviewed and are negative.  Physical Exam Updated Vital Signs BP (!) 119/51   Pulse 73   Temp 98.7 F (37.1 C) (Oral)   Resp 17   SpO2 100%   Physical Exam Vitals and nursing note reviewed.  Constitutional:      Appearance: She is not ill-appearing.  HENT:     Head: Normocephalic and atraumatic.  Eyes:     Conjunctiva/sclera: Conjunctivae normal.  Cardiovascular:     Rate and Rhythm: Normal rate and regular rhythm.  Pulmonary:     Effort: Pulmonary effort is normal.     Breath sounds: Normal breath sounds.  Musculoskeletal:     Comments: 2 cm laceration along the palmar aspect of middle phalanx of the R 5th finger; bleeding controlled. ROM intact to MCP, PIP, and DIP joint. Cap refill < 2 seconds.  2+ radial pulse.   Skin:    General: Skin is warm and dry.     Coloration: Skin is not jaundiced.  Neurological:     Mental Status: She is alert.    ED Results / Procedures / Treatments   Labs (all labs ordered are listed, but only abnormal results are displayed) Labs Reviewed - No data to display  EKG None  Radiology DG Finger Little Right  Result Date: 02/03/2021 CLINICAL DATA:  Laceration of the fifth finger. EXAM: RIGHT LITTLE FINGER 2+V COMPARISON:  None. FINDINGS: Soft tissue deformity but no evidence of fracture or radiopaque foreign object. IMPRESSION: No fracture or visible foreign object. Electronically Signed   By: Paulina Fusi M.D.   On: 02/03/2021 12:23    Procedures .Marland KitchenLaceration Repair  Date/Time: 02/03/2021 5:25 PM Performed by: Tanda Rockers,  PA-C Authorized by: Tanda Rockers, PA-C   Consent:    Consent obtained:  Verbal   Consent given by:  Patient   Risks discussed:  Infection and pain Universal protocol:    Patient identity confirmed:  Verbally with patient Anesthesia:    Anesthesia method:  Nerve block   Block needle gauge:  25 G   Block anesthetic:  Lidocaine 1% w/o epi   Block injection procedure:  Anatomic landmarks identified   Block outcome:  Anesthesia achieved Laceration details:    Location:  Finger   Finger location:  R small finger   Length (cm):  2   Depth (mm):  3 Pre-procedure details:    Preparation:  Patient was prepped and draped in usual sterile fashion Treatment:    Area cleansed with:  Povidone-iodine   Irrigation solution:  Sterile saline Skin repair:    Repair method:  Sutures   Suture size:  4-0   Wound skin closure material used: vicryl rapide.   Suture technique:  Simple interrupted   Number of sutures:  7 Approximation:    Approximation:  Close Repair type:    Repair type:  Intermediate Post-procedure details:    Dressing:  Non-adherent dressing   Procedure completion:  Tolerated well, no immediate complications   Medications Ordered in ED Medications  lidocaine (PF) (XYLOCAINE) 1 % injection 5 mL (has no administration in time range)  Tdap (BOOSTRIX) injection 0.5 mL (0.5 mLs Intramuscular Given 02/03/21 1640)    ED Course  I have reviewed the triage vital signs and the nursing notes.  Pertinent labs & imaging results that were available during my care of the patient were reviewed by me and considered in my medical decision making (see chart for details).    MDM Rules/Calculators/A&P                           65 year old Arabic speaking female presents to the ED today after sustaining a laceration to her right fifth digit earlier today.  Tetanus status unknown.  X-ray without any signs of bony abnormality or foreign body.  Tetanus has been updated in the ED today.  On  exam she has a 2 cm laceration and is neurovascularly intact.  Sutures placed without difficulty.  Patient advised to follow-up with hand surgery for suture removal.  Strict return precautions been discussed with patient.  She is in agreement with plan and stable for discharge.   This note was prepared using Dragon voice recognition software and may include unintentional dictation errors due to the inherent limitations of voice recognition software.   Final Clinical Impression(s) / ED  Diagnoses Final diagnoses:  Laceration of right ring finger without foreign body without damage to nail, initial encounter    Rx / DC Orders ED Discharge Orders     None        Tanda Rockers, PA-C 02/03/21 1730    Gloris Manchester, MD 02/03/21 2000

## 2021-02-05 ENCOUNTER — Ambulatory Visit: Payer: Self-pay

## 2021-02-05 ENCOUNTER — Telehealth: Payer: Self-pay | Admitting: Licensed Clinical Social Worker

## 2021-02-05 NOTE — Patient Instructions (Signed)
Noel Mcelhiney ,   The Medicaid Managed Care Team is available to provide assistance to you with your healthcare needs at no cost and as a benefit of your Medicaid Health plan. Please reach out to me at the number below. I am available to be of assistance to you regarding your healthcare needs. .   Thank you,   Anneta Rounds, BSW, MSW, LCSW Managed Medicaid LCSW [place]  [facility] [email or web address] Phone: [phone]   

## 2021-02-05 NOTE — Patient Outreach (Signed)
  [facility]) Care Management  [facility] Social Work  02/05/2021  Emily Newman 1956/04/18 [id number]  Encounter Medications:  Outpatient Encounter Medications as of 02/05/2021  Medication Sig   amLODipine (NORVASC) 5 MG tablet Take 1 tablet (5 mg total) by mouth at bedtime.   diclofenac Sodium (VOLTAREN) 1 % GEL Apply four times daily to knee (Patient taking differently: Apply 2-4 g topically 4 (four) times daily. Apply to  knee)   omeprazole (PRILOSEC) 40 MG capsule Take 1 capsule (40 mg total) by mouth daily.   No facility-administered encounter medications on file as of 02/05/2021.    Functional Status:  In your present state of health, do you have any difficulty performing the following activities: 06/18/2020  Hearing? N  Vision? Y  Difficulty concentrating or making decisions? N  Walking or climbing stairs? Y  Dressing or bathing? N  Doing errands, shopping? N    Fall/Depression Screening:  PHQ 2/9 Scores 09/17/2020 06/18/2020  PHQ - 2 Score 1 2  PHQ- 9 Score 1 3    Assessment:  Care Plan There are no care plans that you recently modified to display for this patient.    Goals Addressed   None     LCSW was unable to complete additional outreach to patient on 02/05/21 as there were no dinka interpreters available today which is patient's preferred language. [facility] LCSW will reschedule patient's [facility] Social Work appointment as this specific interpreter is needed as patient has had language barriers in the past with arabic and sudanese interpreters.   Plan:  Follow-up:  Follow-up in 14 day(s)  Dickie La, BSW, MSW, LCSW Managed Medicaid LCSW [facility]  [facility] [email or web address] Phone: [phone]

## 2021-02-07 ENCOUNTER — Encounter: Payer: Medicaid Other | Admitting: Family Medicine

## 2021-02-14 ENCOUNTER — Other Ambulatory Visit: Payer: Self-pay

## 2021-02-14 ENCOUNTER — Encounter: Payer: Self-pay | Admitting: Family Medicine

## 2021-02-14 ENCOUNTER — Ambulatory Visit (INDEPENDENT_AMBULATORY_CARE_PROVIDER_SITE_OTHER): Payer: Medicaid Other | Admitting: Family Medicine

## 2021-02-14 VITALS — BP 110/62 | HR 81 | Wt 138.6 lb

## 2021-02-14 DIAGNOSIS — S61217D Laceration without foreign body of left little finger without damage to nail, subsequent encounter: Secondary | ICD-10-CM

## 2021-02-14 DIAGNOSIS — I10 Essential (primary) hypertension: Secondary | ICD-10-CM | POA: Diagnosis not present
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
## 2021-02-14 NOTE — Patient Instructions (Addendum)
It was wonderful to see you today.  Please bring ALL of your medications with you to every visit.   Today we talked about:  - Going to [facility] TODAY   [address], [place], [place] [address]  IT IS VERY VERY IMPORTANT YOU HAVE YOUR SUTURES REMOVED   Please continue your blood pressure pill  Follow up in  October      Thank you for choosing [facility] Family Medicine.   Please call [phone] with any questions about today's appointment.  Please be sure to schedule follow up at the front  desk before you leave today.   [name], MD  Family Medicine

## 2021-02-14 NOTE — Assessment & Plan Note (Signed)
Markedly improved on repeat and asymptomatic continue current therapy.

## 2021-02-15 LAB — BASIC METABOLIC PANEL
BUN/Creatinine Ratio: 14 (ref 12–28)
BUN: 11 mg/dL (ref 8–27)
CO2: 23 mmol/L (ref 20–29)
Calcium: 9.1 mg/dL (ref 8.7–10.3)
Chloride: 103 mmol/L (ref 96–106)
Creatinine, Ser: 0.77 mg/dL (ref 0.57–1.00)
Glucose: 98 mg/dL (ref 65–99)
Potassium: 4 mmol/L (ref 3.5–5.2)
Sodium: 140 mmol/L (ref 134–144)
eGFR: 86 mL/min/{1.73_m2} (ref >59)

## 2021-02-17 ENCOUNTER — Telehealth: Payer: Self-pay | Admitting: Family Medicine

## 2021-02-17 NOTE — Telephone Encounter (Signed)
Called [facility] with results. BMP normal. They have seen Ortho--and have follow up.   Discussed upcoming visit in refugee clinic for HCM items.  [name], MD  Family Medicine Teaching Service

## 2021-02-18 ENCOUNTER — Ambulatory Visit: Payer: Self-pay

## 2021-02-18 ENCOUNTER — Telehealth: Payer: Self-pay | Admitting: Licensed Clinical Social Worker

## 2021-02-18 NOTE — Patient Outreach (Signed)
  [facility]) Care Management  [facility] Social Work  02/18/2021  Meliya Gillean 28-Aug-1955 [id number]   Encounter Medications:  Outpatient Encounter Medications as of 02/18/2021  Medication Sig   amLODipine (NORVASC) 5 MG tablet Take 1 tablet (5 mg total) by mouth at bedtime.   diclofenac Sodium (VOLTAREN) 1 % GEL Apply four times daily to knee (Patient taking differently: Apply 2-4 g topically 4 (four) times daily. Apply to  knee)   omeprazole (PRILOSEC) 40 MG capsule Take 1 capsule (40 mg total) by mouth daily.   No facility-administered encounter medications on file as of 02/18/2021.    Functional Status:  In your present state of health, do you have any difficulty performing the following activities: 06/18/2020  Hearing? N  Vision? Y  Difficulty concentrating or making decisions? N  Walking or climbing stairs? Y  Dressing or bathing? N  Doing errands, shopping? N    Fall/Depression Screening:  PHQ 2/9 Scores 09/17/2020 06/18/2020  PHQ - 2 Score 1 2  PHQ- 9 Score 1 3    Assessment:  Care Plan There are no care plans that you recently modified to display for this patient.    Goals Addressed   None     LCSW completed [facility] final outreach attempt today but was unable to reach patient successfully.[facility] LCSW will close referral on 02/18/21 if no return call is made by patient.    LCSW has been unable to make contact with the patient for follow up. The [facility] care management team SW is available to follow up with the patient after provider conversation with the patient regarding recommendation for care management engagement and subsequent re-referral to the care management team.   Dickie La, BSW, MSW, LCSW Managed Medicaid LCSW [facility]  [facility] [email or web address] Phone: [phone]

## 2021-02-18 NOTE — Patient Instructions (Signed)
Emily Newman ,   The Medicaid Managed Care Team is available to provide assistance to you with your healthcare needs at no cost and as a benefit of your [facility] Health plan. We have been unable to reach you on 3 separate attempts. The care management team is available to assist with your healthcare needs at any time. Please do not hesitate to contact me at the number below. .   Thank you,   Dickie La, BSW, MSW, LCSW Managed Medicaid LCSW [facility]  [email or web address] Phone: [phone]

## 2021-02-20 ENCOUNTER — Ambulatory Visit: Payer: Medicaid Other | Admitting: Orthopaedic Surgery

## 2021-03-11 ENCOUNTER — Ambulatory Visit: Payer: Medicaid Other

## 2021-03-15 ENCOUNTER — Other Ambulatory Visit: Payer: Self-pay | Admitting: Family

## 2021-03-15 DIAGNOSIS — K219 Gastro-esophageal reflux disease without esophagitis: Secondary | ICD-10-CM

## 2021-03-28 ENCOUNTER — Ambulatory Visit (INDEPENDENT_AMBULATORY_CARE_PROVIDER_SITE_OTHER): Payer: Medicaid Other | Admitting: Family Medicine

## 2021-03-28 ENCOUNTER — Other Ambulatory Visit: Payer: Self-pay

## 2021-03-28 ENCOUNTER — Encounter: Payer: Self-pay | Admitting: Family Medicine

## 2021-03-28 VITALS — BP 142/85 | HR 95 | Wt 140.4 lb

## 2021-03-28 DIAGNOSIS — I10 Essential (primary) hypertension: Secondary | ICD-10-CM

## 2021-03-28 DIAGNOSIS — G8929 Other chronic pain: Secondary | ICD-10-CM

## 2021-03-28 DIAGNOSIS — Z1239 Encounter for other screening for malignant neoplasm of breast: Secondary | ICD-10-CM

## 2021-03-28 DIAGNOSIS — M25561 Pain in right knee: Secondary | ICD-10-CM | POA: Diagnosis not present

## 2021-03-28 DIAGNOSIS — R1013 Epigastric pain: Secondary | ICD-10-CM | POA: Diagnosis not present

## 2021-03-28 DIAGNOSIS — M545 Low back pain, unspecified: Secondary | ICD-10-CM | POA: Diagnosis not present

## 2021-03-28 MED ORDER — AMLODIPINE BESYLATE 5 MG PO TABS: 10.0000 mg | ORAL_TABLET | Freq: Every day | ORAL | 3 refills | Status: DC

## 2021-03-28 NOTE — Patient Instructions (Signed)
It was wonderful to see you today.  Please bring ALL of your medications with you to every visit.   Today we talked about:   -Taking 2 tablets of your blood pressure pill     -Taking 2 tablets of Tylenol 2 times per day   --Getting x-rays of your back and knee    An x-ray was ordered for you---you do not need an appointment to have this completed.  I recommend going to [facility] [address] [place] [place] [address] [place]   If the results are normal,I will send you a letter  I will call you with results if anything is abnormal    Thank you for choosing [facility] Medicine.   Please call [phone] with any questions about today's appointment.  Please be sure to schedule follow up at the front  desk before you leave today.   [name], MD  Family Medicine

## 2021-03-28 NOTE — Assessment & Plan Note (Signed)
Differential includes reflux, functional dyspepsia or H. pylori.  She has been off of PPI therapy will send H. pylori breath test.  If this is negative we will refill PPI.  Otherwise will need quadruple therapy.  She has never previously received treatment.

## 2021-03-28 NOTE — Assessment & Plan Note (Signed)
Blood pressure continues to be above goal.  No symptoms of elevated blood pressure.  Increased amlodipine to 10 mg.  Follow-up in 1 month refugee clinic for check.  Reviewed precautions with family and patient about orthostasis.
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
## 2021-03-29 LAB — H. PYLORI BREATH TEST: H pylori Breath Test: NEGATIVE

## 2021-03-30 ENCOUNTER — Encounter: Payer: Self-pay | Admitting: Family Medicine

## 2021-03-31 ENCOUNTER — Ambulatory Visit: Payer: Medicaid Other | Admitting: Family Medicine

## 2021-04-02 ENCOUNTER — Telehealth: Payer: Self-pay | Admitting: Family Medicine

## 2021-04-02 DIAGNOSIS — K219 Gastro-esophageal reflux disease without esophagitis: Secondary | ICD-10-CM

## 2021-04-02 MED ORDER — OMEPRAZOLE 40 MG PO CPDR: 40.0000 mg | DELAYED_RELEASE_CAPSULE | Freq: Every day | ORAL | 0 refills | Status: DC

## 2021-04-02 NOTE — Telephone Encounter (Signed)
H. pylori is negative.  Given intermittent dyspepsia without red flags we will refill PPI.  Consider referral to gastroenterology at follow-up.  I attempted to call her niece about this.  Left generic voicemail to call back.

## 2021-04-03 ENCOUNTER — Ambulatory Visit
Admission: RE | Admit: 2021-04-03 | Discharge: 2021-04-03 | Disposition: A | Discharge status: Home / Self Care | Payer: Medicaid Other | Source: Ambulatory Visit | Attending: Family Medicine | Admitting: Family Medicine

## 2021-04-03 DIAGNOSIS — G8929 Other chronic pain: Secondary | ICD-10-CM

## 2021-04-05 ENCOUNTER — Encounter: Payer: Self-pay | Admitting: Family Medicine

## 2021-04-05 ENCOUNTER — Telehealth: Payer: Self-pay | Admitting: Family Medicine

## 2021-04-05 DIAGNOSIS — M1711 Unilateral primary osteoarthritis, right knee: Secondary | ICD-10-CM | POA: Insufficient documentation

## 2021-04-05 NOTE — Telephone Encounter (Signed)
Attempted to call niece, [name]. Left generic voicemail. Will send letter. X-ray shows only arthritis.  Consider steroid injection at follow up  [name], MD  [facility] Medicine Teaching Service

## 2021-05-06 ENCOUNTER — Ambulatory Visit: Payer: Medicaid Other

## 2021-05-15 ENCOUNTER — Ambulatory Visit (INDEPENDENT_AMBULATORY_CARE_PROVIDER_SITE_OTHER): Payer: Medicaid Other

## 2021-05-15 ENCOUNTER — Other Ambulatory Visit: Payer: Self-pay

## 2021-05-15 DIAGNOSIS — Z23 Encounter for immunization: Secondary | ICD-10-CM | POA: Diagnosis present

## 2021-05-15 NOTE — Progress Notes (Signed)
Patient presents to nurse clinic for flu vaccination. Patient also has questions regarding Tdap vaccination. Delayed Tdap as patient received last dose on 02/03/2021. Patient has follow up with Dr. Manson Passey on Tuesday, 11/22 and will further discuss at this visit.   Administered flu vaccination in RD, site unremarkable, tolerated injection well.   Veronda Prude, RN

## 2021-05-20 ENCOUNTER — Other Ambulatory Visit: Payer: Self-pay

## 2021-05-20 ENCOUNTER — Ambulatory Visit (INDEPENDENT_AMBULATORY_CARE_PROVIDER_SITE_OTHER): Payer: Medicaid Other | Admitting: Family Medicine

## 2021-05-20 ENCOUNTER — Ambulatory Visit (INDEPENDENT_AMBULATORY_CARE_PROVIDER_SITE_OTHER): Payer: Medicaid Other

## 2021-05-20 VITALS — BP 143/79 | HR 70 | Ht 66.0 in | Wt 141.5 lb

## 2021-05-20 DIAGNOSIS — Z23 Encounter for immunization: Secondary | ICD-10-CM

## 2021-05-20 DIAGNOSIS — L259 Unspecified contact dermatitis, unspecified cause: Secondary | ICD-10-CM

## 2021-05-20 DIAGNOSIS — I10 Essential (primary) hypertension: Secondary | ICD-10-CM

## 2021-05-20 DIAGNOSIS — Z59819 Housing instability, housed unspecified: Secondary | ICD-10-CM | POA: Diagnosis not present

## 2021-05-20 MED ORDER — AMLODIPINE BESYLATE 5 MG PO TABS: 5.0000 mg | ORAL_TABLET | Freq: Every day | ORAL | 3 refills | Status: DC

## 2021-05-20 MED ORDER — TRIAMCINOLONE ACETONIDE 0.5 % EX OINT: 1.0000 "application " | TOPICAL_OINTMENT | Freq: Every day | CUTANEOUS | 0 refills | Status: DC

## 2021-05-20 MED ORDER — SHINGRIX 50 MCG/0.5ML IM SUSR: 0.5000 mL | INTRAMUSCULAR | 0 refills | Status: AC

## 2021-05-20 NOTE — Progress Notes (Signed)
    SUBJECTIVE:   CHIEF COMPLAINT:BP check  HPI:   Emily Newman is a 65 y.o. yo with history notable for hypertension  presenting for medication check.  The patient speaks [place] as their primary language.  An interpreter was used for the entire visit.   Patient reports she has an area of pruritus on her back.  This is located on the right side of her back.  She is uncertain of the trigger she has been trying nothing for this.  Patient reports her back pain is improved.  She denies lower extremity numbness weakness or bowel or bladder incontinence.  The patient reports compliance with her antihypertensive therapy.  The patient reports that her bilateral knee pain is improved as well.  X-rays of her back and knees were reviewed today.  We also reviewed how to take Voltaren gel.  Patient is due for a Pap smear.  Although she is now 85 she has never had 1.  We discussed this today.  She is hesitant to get 1 but after discussion is possibly amenable to next time.    PERTINENT  PMH / PSH/Family/Social History : hypertension   OBJECTIVE:   BP (!) 143/79   Pulse 70   Ht 5\' 6"  (1.676 m)   Wt 141 lb 8 oz (64.2 kg)   SpO2 100%   BMI 22.84 kg/m   Today's weight:  Last Weight  Most recent update: 05/20/2021  9:08 AM    Weight  64.2 kg (141 lb 8 oz)            Review of prior weights: Filed Weights   05/20/21 0907  Weight: 141 lb 8 oz (64.2 kg)  Cardiac: Regular rate and rhythm. Normal S1/S2. No murmurs, rubs, or gallops appreciated. Lungs: Clear bilaterally to ascultation.  Abdomen: Normoactive bowel sounds. No tenderness to deep or light palpation. No rebound or guarding.   Psych: Pleasant and appropriate  Patient is examined there is writes words scoliosis.  Just inferior to the T10 area there is an area of hyperpigmentation overlying excoriation. ASSESSMENT/PLAN:   Essential hypertension Continue current therapy. Follow up 3 months for BP check.    Housing insecurity the  patient has 2 grandchildren for whom she cares for.  She is applied for low income housing and senior housing is not yet heard back.  She is currently living with her niece.  She feels her housing future is uncertain.   Refer to chronic care management care navigator for assistance with this.  Please call niece with an Arabic speaking interpreter or the patient with the 05/22/21 speaking interpreter.  Dyspepsia, no red flags, consider EGD in future given age, at this is improved at this time we will not restart a PPI or pursue an EGD.  Contact dermatitis no pain or rash suggestive of shingles.  Prescribed topical corticosteroids.  Suspect this is related to insect bite and/or her bra strap.  HCM Items - Tdap, Hep A, COVID and PCV20 today  -Discussed Pap -Advance directive packet was given.  I discussed with her and her niece Emily Newman  - Discuss CSY at follow up Thyroid ultrasound in March  RX for shingles today     April, MD  Family Medicine Teaching Service  [facility]

## 2021-05-20 NOTE — Patient Instructions (Signed)
It was wonderful to see you today.  Please bring ALL of your medications with you to every visit.   Today we talked about:  --Using Voltaren gel on your back   --For the itching you can use the ointment on your rash on your back   --Your blood pressure looks good  -- we will do your womens exam next time    Thank you for choosing [facility].   Please call [phone] with any questions about today's appointment.  Please be sure to schedule follow up at the front  desk before you leave today.   [name], MD  Family Medicine

## 2021-05-20 NOTE — Assessment & Plan Note (Signed)
Continue current therapy. Follow up 3 months for BP check.

## 2021-05-28 ENCOUNTER — Other Ambulatory Visit: Payer: Self-pay

## 2021-05-28 NOTE — Patient Instructions (Signed)
Visit Information  Ms. Sagal Aust  - as a part of your Medicaid benefit, you are eligible for care management and care coordination services at no cost or copay. I was unable to reach you by phone today but would be happy to help you with your health related needs. Please feel free to call me @ [phone].   A member of the Managed Medicaid care management team will reach out to you again over the next 7 days.   Gus Puma, BSW, [place] [facility]  [employer] Risk Managed Medicaid Team  [phone]

## 2021-05-28 NOTE — Patient Outreach (Signed)
Care Coordination  05/28/2021  [name] [birth year] [id number]   Medicaid Managed Care   Unsuccessful Outreach Note  05/28/2021 Name: Emily Newman MRN: [id number] DOB: 17-Jun-1956  Referred by: Westley Chandler, MD Reason for referral : High Risk Managed Medicaid (MM Social Work Telephone Unsuccessful Outreach)   An unsuccessful telephone outreach was attempted today. The patient was referred to the case management team for assistance with care management and care coordination.   Follow Up Plan: The care management team will reach out to the patient again over the next 7 days.

## 2021-06-04 IMAGING — US US THYROID
1 series · 13 of 25 positions shown · non-contrast
Comparison: None available

CLINICAL DATA: Thyroid nodule on exam, remote right thyroidectomy

EXAM:
THYROID ULTRASOUND
TECHNIQUE: Ultrasound examination of the thyroid gland and adjacent soft
tissues was performed.

[Series 1: us thyroid · 13 of 52 slices shown]
[im 1/52]
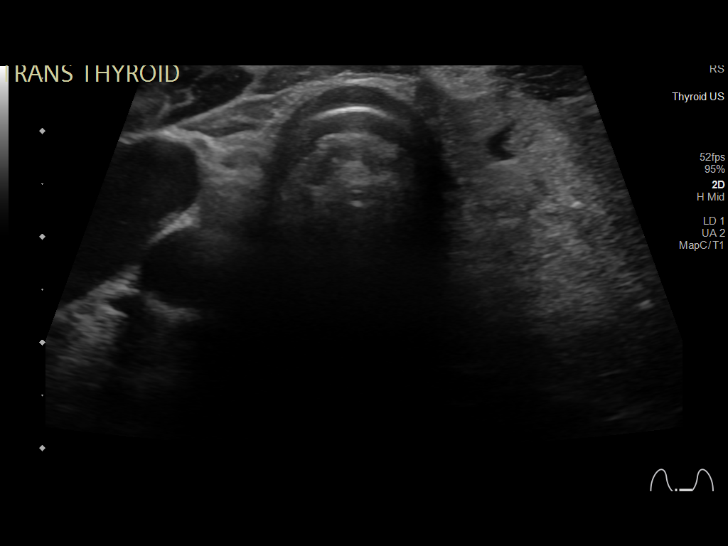
[im 5/52]
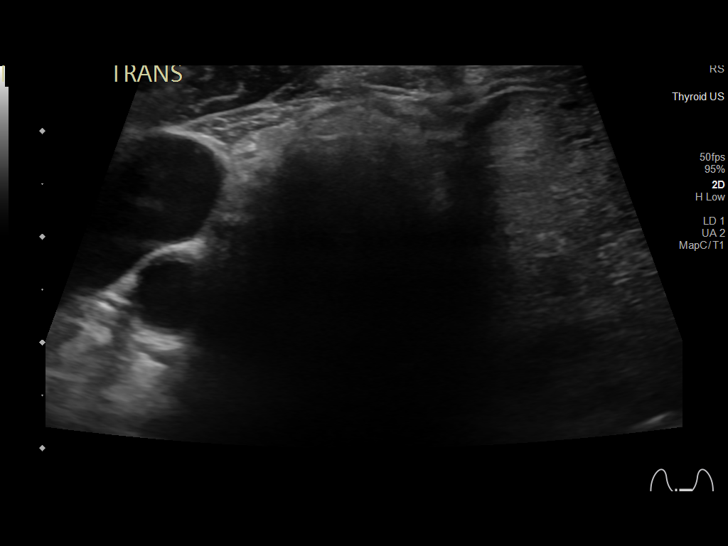
[im 9/52]
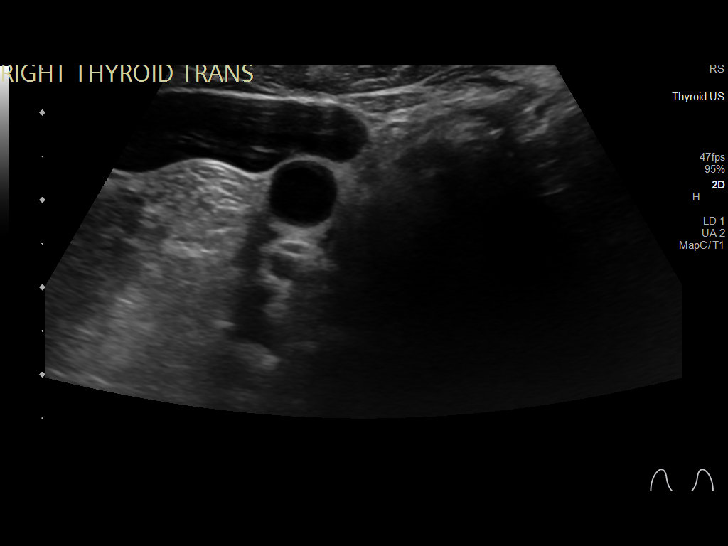
[im 13/52]
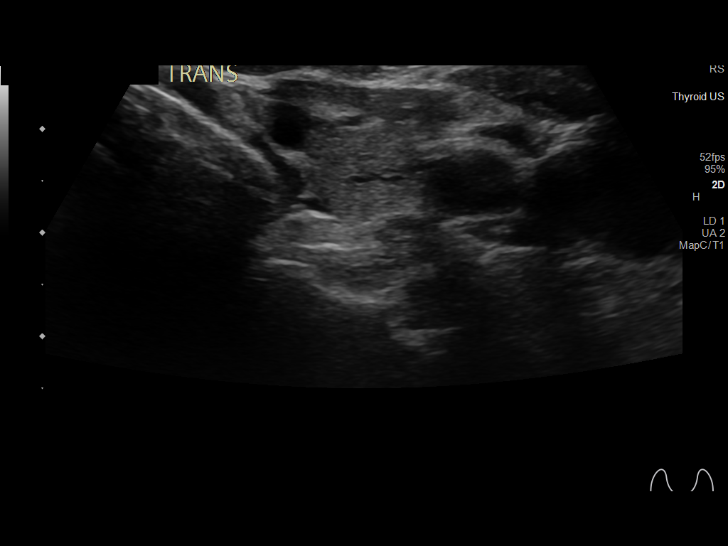
[im 18/52]
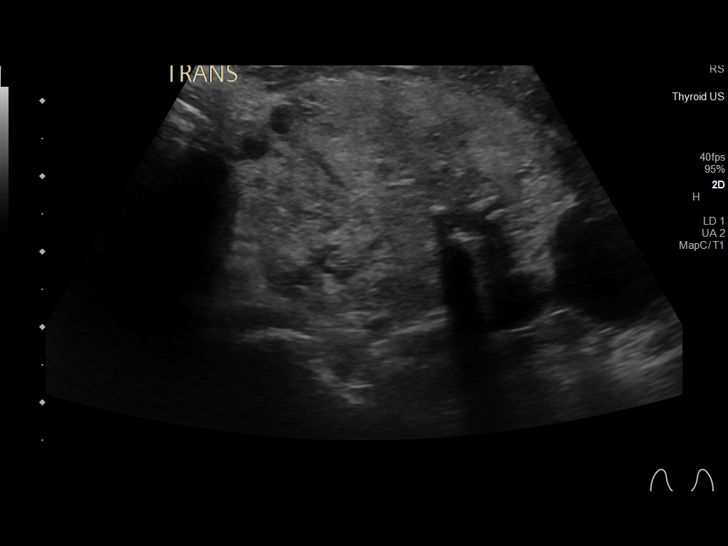
[im 22/52]
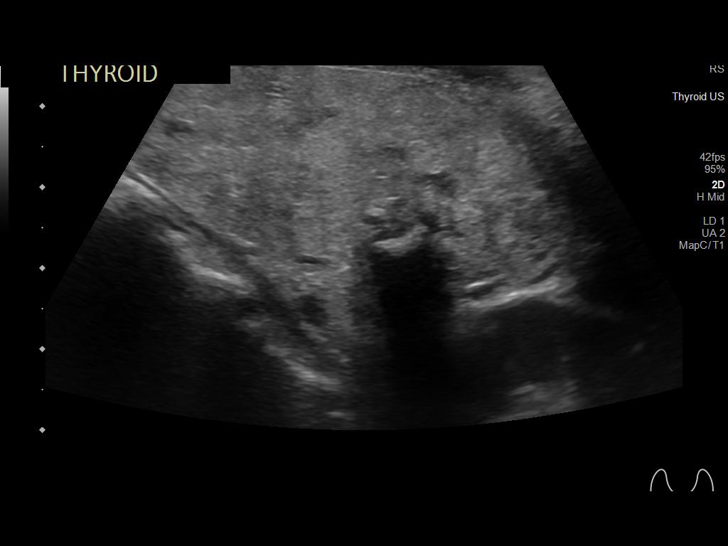
[im 26/52]
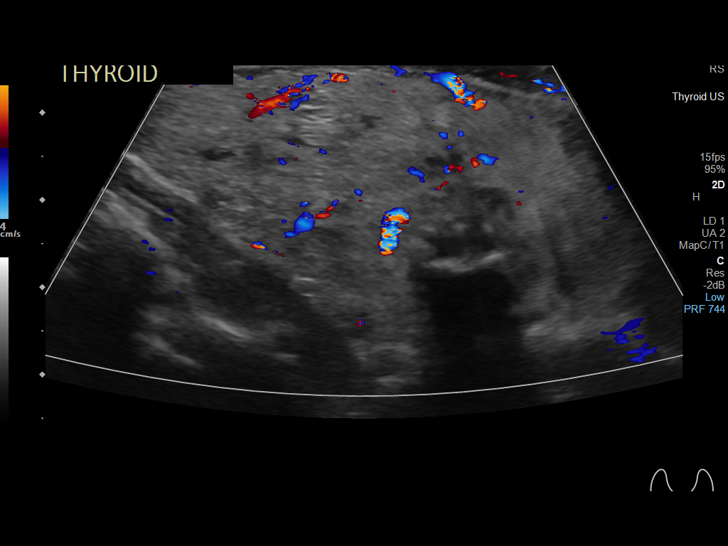
[im 30/52]
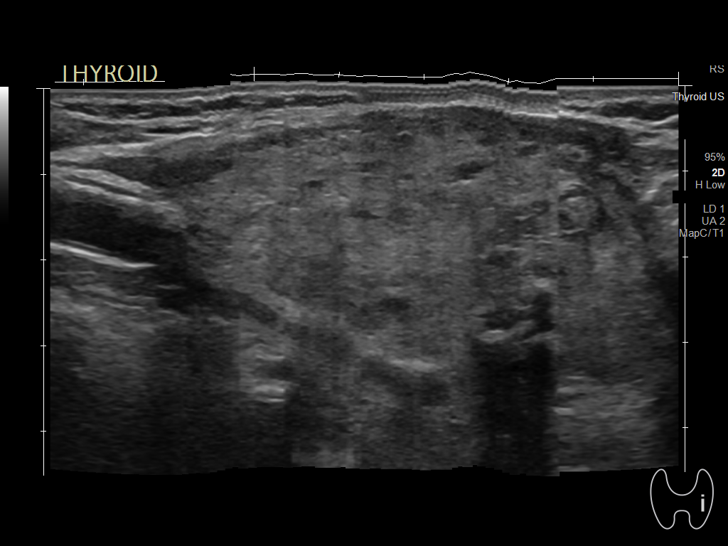
[im 35/52]
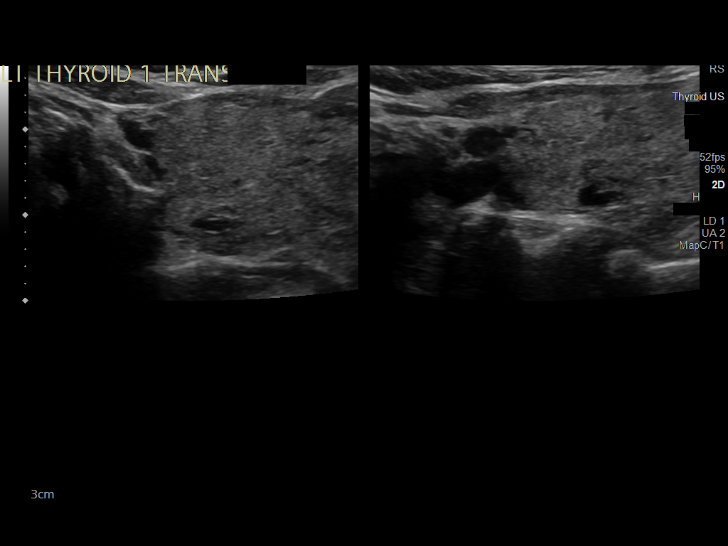
[im 39/52]
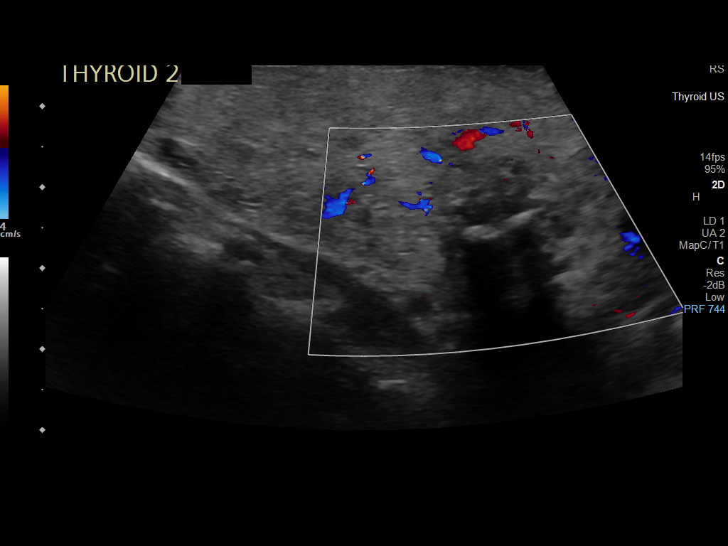
[im 43/52]
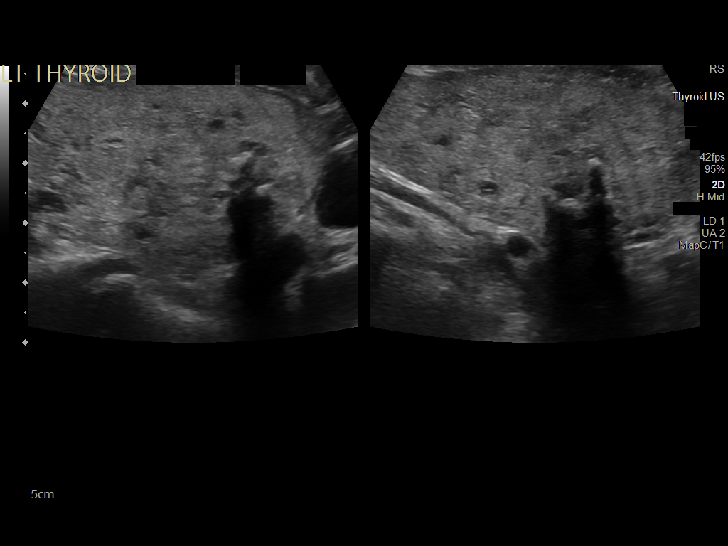
[im 47/52]
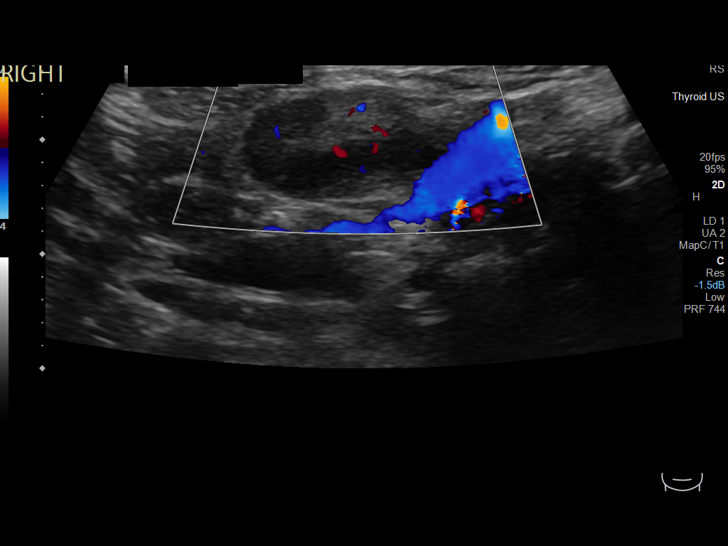
[im 52/52]
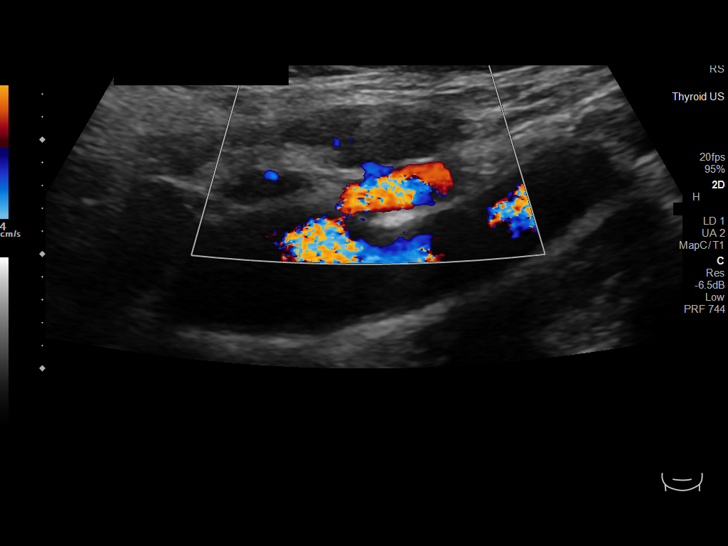

[13 of 25 positions shown; findings below may reference images not displayed]

FINDINGS: Parenchymal Echotexture: Markedly heterogenous

Isthmus: 3 mm

Right lobe: Surgically absent

Left lobe: 6.2 x 3.5 x 4 5 cm

_________________________________________________________

Estimated total number of nodules >/= 1 cm: 1

Number of spongiform nodules >/=  2 cm not described below (TR1): 0

Number of mixed cystic and solid nodules >/= 1.5 cm not described
below (TR2): 0

_________________________________________________________

Nodule # 2:

Location: Left; Inferior

Maximum size: 1.1 cm; Other 2 dimensions: 1.1 x 1.0 cm (lesion was
remeasured because the size appears to be overestimated initially

Composition: solid/almost completely solid (2)

Echogenicity: cannot determine (1)

Shape: not taller-than-wide (0)

Margins: ill-defined (0)

Echogenic foci: macrocalcifications (1)

ACR TI-RADS total points: 4.

ACR TI-RADS risk category: TR4 (4-6 points).

ACR TI-RADS recommendations:

*Given size (>/= 1 - 1.4 cm) and appearance, a follow-up ultrasound
in 1 year should be considered based on TI-RADS criteria.

_________________________________________________________

Additional subcentimeter left mid thyroid mixed cystic/solid nodule
measures only 7 mm. This would not meet criteria for any biopsy or
follow-up and is not fully described by TI rads criteria.

Remote right thyroidectomy. No hypervascularity. Mildly prominent
cervical lymph nodes noted bilaterally with preserved architecture
measuring 8 mm in short axis on the right and 5 mm in short axis on
the left.
IMPRESSION: 1.1 cm left inferior TR 4 nodule with macrocalcifications meets
criteria for follow-up in 1 year.

Remote right thyroidectomy

Nonspecific background left thyroid heterogeneity suggesting chronic
medical thyroid disease.

The above is in keeping with the ACR TI-RADS recommendations - [HOSPITAL] 3026;[DATE].

## 2021-06-09 ENCOUNTER — Other Ambulatory Visit: Payer: Self-pay

## 2021-06-09 NOTE — Patient Outreach (Signed)
  Medicaid Managed Care Social Work Note  06/09/2021 Name:  Mamie Hundertmark MRN:  [id number] DOB:  1956/06/24  Esraa Mcwethy is an 65 y.o. year old female who is a primary patient of Westley Chandler, MD.  The Medicaid Managed Care Coordination team was consulted for assistance with:   housing  Ms. Gust was given information about Medicaid Managed Care Coordination team services today. Aaliya Lickteig Patient agreed to services and verbal consent obtained.  Engaged with patient  for by telephone forinitial visit in response to referral for case management and/or care coordination services.   Assessments/Interventions:  Review of past medical history, allergies, medications, health status, including review of consultants reports, laboratory and other test data, was performed as part of comprehensive evaluation and provision of chronic care management services.  SDOH: (Social Determinant of Health) assessments and interventions performed: BSW contacted and spoke with patient's niece. She states that patient is currently receiving Social Security at 800 per month. Patient has already been placed on the Section 8 list. Patient does receive foodstamps. BSW asked if she would like a list of income driven housing resources, niece stated she did not, patient would like to wait on Section 8 housing. BSW informed that the waitlist can be up to 1-2 years.  Advanced Directives Status:  Not addressed in this encounter.  Care Plan                 No Known Allergies  Medications Reviewed Today     Reviewed by Westley Chandler, MD (Physician) on 05/20/21 at [id number]  Med List Status: <None>   Medication Order Taking? Sig Documenting Provider Last Dose Status Informant  Discontinued 03/28/21 0935   amLODipine (NORVASC) 5 MG tablet [id number]  Take 2 tablets (10 mg total) by mouth at bedtime. Westley Chandler, MD  Active   diclofenac Sodium (VOLTAREN) 1 % GEL [id number] No Apply four times daily to knee  Patient not taking:  Reported on 03/28/2021   Westley Chandler, MD Not Taking Active   omeprazole (PRILOSEC) 40 MG capsule [id number]  Take 1 capsule (40 mg total) by mouth daily. Westley Chandler, MD  Active   Zoster Vaccine Adjuvanted [facility]) injection [id number] Yes Inject 0.5 mLs into the muscle every 2 (two) months for 2 doses. Westley Chandler, MD  Active             Patient Active Problem List   Diagnosis Date Noted   Osteoarthritis of right knee 04/05/2021   Essential hypertension 07/17/2020   Epigastric pain 07/17/2020   Vision impairment 07/17/2020   Thyroid nodule 07/17/2020   Refugee health examination 07/16/2020    Conditions to be addressed/monitored per PCP order:   housing  There are no care plans that you recently modified to display for this patient.   Follow up:  Patient agrees to Care Plan and Follow-up.  Plan: The Managed Medicaid care management team will reach out to the patient again over the next 30 days.  Date/time of next scheduled Social Work care management/care coordination outreach:  07/10/20  Gus Puma, Kenard Gower, [facility]  [facility]  High Risk Managed Medicaid Team  [phone]

## 2021-06-09 NOTE — Patient Instructions (Signed)
Visit Information  Emily Newman was given information about Medicaid Managed Care team care coordination services as a part of their [facility] Medicaid benefit. Emily Newman verbally consented to engagement with the [facility] Managed Care team.   If you are experiencing a medical emergency, please call 911 or report to your local emergency department or urgent care.   If you have a non-emergency medical problem during routine business hours, please contact your provider's office and ask to speak with a nurse.   For questions related to your [facility] health plan, please call: [phone] or go here:[email or web address]  If you would like to schedule transportation through your [facility] plan, please call the following number at least 2 days in advance of your appointment: [phone].  Call the [facility] Crisis Line at [phone], at any time, 24 hours a day, 7 days a week. If you are in danger or need immediate medical attention call 911.  If you would like help to quit smoking, call 1-800-QUIT-NOW (708-740-9096) OR Espaol: 1-855-Djelo-Ya (6-270-350-0938) o para ms informacin haga clic aqu or Text READY to 182-993 to register via text  Emily Newman - following are the goals we discussed in your visit today:   Goals Addressed   None     Social Worker will follow up in 30 days.   Gus Puma, BSW, [place] [facility]  [place]  High Risk Managed Medicaid Team  [phone]   Following is a copy of your plan of care:  There are no care plans that you recently modified to display for this patient.

## 2021-07-10 ENCOUNTER — Other Ambulatory Visit: Payer: Self-pay

## 2021-07-10 NOTE — Patient Instructions (Signed)
Visit Information  Ms. Emily Newman  - as a part of your Medicaid benefit, you are eligible for care management and care coordination services at no cost or copay. I was unable to reach you by phone today but would be happy to help you with your health related needs. Please feel free to call me @ [phone]  A member of the Managed Medicaid care management team will reach out to you again over the next 30-45 days.   Gus Puma, BSW, [place] [facility]   [employer] Risk Managed Medicaid Team  [phone]

## 2021-07-10 NOTE — Patient Outreach (Signed)
Care Coordination  07/10/2021  [name] 04/20/1956 [id number]   Medicaid Managed Care   Unsuccessful Outreach Note  07/10/2021 Name: Emily Newman MRN: [id number] DOB: [birth year]  Referred by: Westley Chandler, MD Reason for referral : High Risk Managed Medicaid (MM Social work unsuccessful [employer])   An unsuccessful telephone outreach was attempted today. The patient was referred to the case management team for assistance with care management and care coordination.   Follow Up Plan: The care management team will reach out to the patient again over the next 30-45 days.   Gus Puma, BSW, [place] [facility]   [employer] Risk Managed Medicaid Team  [phone]

## 2021-07-11 ENCOUNTER — Telehealth: Payer: Self-pay | Admitting: Family Medicine

## 2021-07-11 ENCOUNTER — Encounter: Payer: Medicaid Other | Admitting: Family Medicine

## 2021-07-11 NOTE — Telephone Encounter (Signed)
Attempted to call niece about missed visit. Nursing- can you please reach out to [name] (niece) to reschedule with me in coming months?  [name], MD  Family Medicine Teaching Service

## 2021-07-12 NOTE — Progress Notes (Signed)
No show for visit      

## 2021-07-14 NOTE — Telephone Encounter (Signed)
Appointment made.  .Neno Hohensee R Taleyah Hillman, CMA  

## 2021-07-15 NOTE — Congregational Nurse Program (Signed)
Routine blood pressure check.  Counseled to reduce salt intake and to eat a heart-healthy diet for her elevated BP. Noted PCP appointment for Jul 23, 2021.  Patient reminded of the appointment.  Earlie Server Shavone Nevers RN [facility] [place] [id number]

## 2021-07-16 NOTE — Congregational Nurse Program (Signed)
°  Dept: [place] Nurse Program Note  Date of Encounter: 07/16/2021  Past Medical History: Past Medical History:  Diagnosis Date   GERD (gastroesophageal reflux disease)    Hypertension    Vision impairment     Encounter Details:   Patient arrived to visit for concerns of hypertension. Blood pressure checked 145/90, improved since yesterday's reading. Patient brought her medications, noted she was taking her medication incorrectly. Taking 1 pill during day of Norvasc instead of 2 pills, daily at bedtime. Patient educated using [place] interpreter. Patient verbalized understanding of correct use of medication. Patient will return next week for follow-up blood pressure check.  Earlie Server Kimia Finan RN [facility] [place] [phone]

## 2021-07-23 ENCOUNTER — Ambulatory Visit (INDEPENDENT_AMBULATORY_CARE_PROVIDER_SITE_OTHER): Payer: Medicaid Other | Admitting: Family Medicine

## 2021-07-23 ENCOUNTER — Encounter: Payer: Self-pay | Admitting: Family Medicine

## 2021-07-23 ENCOUNTER — Other Ambulatory Visit: Payer: Self-pay

## 2021-07-23 ENCOUNTER — Other Ambulatory Visit (HOSPITAL_COMMUNITY)
Admission: RE | Admit: 2021-07-23 | Discharge: 2021-07-23 | Disposition: A | Discharge status: Home / Self Care | Payer: Medicaid Other | Source: Ambulatory Visit | Attending: Family Medicine | Admitting: Family Medicine

## 2021-07-23 VITALS — BP 156/87 | HR 77 | Wt 141.8 lb

## 2021-07-23 DIAGNOSIS — E041 Nontoxic single thyroid nodule: Secondary | ICD-10-CM | POA: Diagnosis not present

## 2021-07-23 DIAGNOSIS — N812 Incomplete uterovaginal prolapse: Secondary | ICD-10-CM | POA: Diagnosis not present

## 2021-07-23 DIAGNOSIS — Z59819 Housing instability, housed unspecified: Secondary | ICD-10-CM

## 2021-07-23 DIAGNOSIS — K219 Gastro-esophageal reflux disease without esophagitis: Secondary | ICD-10-CM | POA: Diagnosis not present

## 2021-07-23 DIAGNOSIS — Z124 Encounter for screening for malignant neoplasm of cervix: Secondary | ICD-10-CM | POA: Diagnosis present

## 2021-07-23 DIAGNOSIS — R1013 Epigastric pain: Secondary | ICD-10-CM | POA: Diagnosis not present

## 2021-07-23 DIAGNOSIS — R052 Subacute cough: Secondary | ICD-10-CM

## 2021-07-23 DIAGNOSIS — I10 Essential (primary) hypertension: Secondary | ICD-10-CM

## 2021-07-23 MED ORDER — OMEPRAZOLE 40 MG PO CPDR: 40.0000 mg | DELAYED_RELEASE_CAPSULE | Freq: Every day | ORAL | 0 refills | Status: DC

## 2021-07-23 MED ORDER — AMLODIPINE BESYLATE 5 MG PO TABS: 5.0000 mg | ORAL_TABLET | Freq: Every day | ORAL | 3 refills | Status: DC

## 2021-07-23 NOTE — Assessment & Plan Note (Signed)
Not at goal as out of amlodipine. New prescription to pharmacy.

## 2021-07-23 NOTE — Progress Notes (Signed)
° ° °  SUBJECTIVE:   CHIEF COMPLAINT: cough  HPI:   Emily Newman is a 66 y.o.  with history notable for hypertension and suspected GERD presenting for cough.  The patient speaks [place].  Her niece is with her today.  An Arabic interpreter was offered but declined by the patient and the family.  The patient reports an ongoing cough.  She reports this is been ongoing for about a month or so.  She reports since she stopped her PPI this is worsened.  She additionally endorses intermittent epigastric pain and has been vomiting 1-2 times per week after large meals.  She denies pain with swallowing, difficulty swallowing, hematemesis, melena, hematochezia.  Her weight has been stable. No fevers, hemoptysis, has negative imaging [place] for [place]. No dyspnea or chest pain.   The patient reports she has run out of her 5 mg of amlodipine, taking 2.5 mg. No headaches, chest pain, dyspnea.   Patient has never had a Pap smear.  She denies vaginal bleeding vaginal pain or any concerns.  She denies difficulty urinating.  PERTINENT  PMH / PSH/Family/Social History : updated and reviewed   OBJECTIVE:   BP (!) 156/87    Pulse 77    Wt 141 lb 12.8 oz (64.3 kg)    SpO2 100%    BMI 22.89 kg/m   Today's weight:  Last Weight  Most recent update: 07/23/2021  9:08 AM    Weight  64.3 kg (141 lb 12.8 oz)            Review of prior weights: Filed Weights   07/23/21 0906  Weight: 141 lb 12.8 oz (64.3 kg)    Cardiac: Regular rate and rhythm. Normal S1/S2. No murmurs, rubs, or gallops appreciated. Lungs: Clear bilaterally to ascultation.  Psych: Pleasant and appropriate  GU Exam:  Chaperoned exam.  External exam: Absence of labia minora bilaterally, there is prolapse of the cervix to the vaginal introitus.  This has some overlying thickened slightly white appearing tissue overlying the os of the cervix.  No other masses or abnormalities.  Pap smear obtained.  Chaperoned exam with CMA M. Simpson.      ASSESSMENT/PLAN:   Essential hypertension Not at goal as out of amlodipine. New prescription to pharmacy.   Thyroid nodule Repeat thyroid studies today. Will order ultrasound and schedule in March visit.   Epigastric pain Worsening, with red flag of vomiting. Referral to Gastroenterology, discussed importance of answering phone. Refilled PPI which helped to some degree. Return precautions given.  CBC today to evaluate for anemia given symptoms and some concern for gastritis.  Differential includes gastritis, less likely H. pylori she has had  negative testing in the past, peptic ulcer disease.   Subacute cough recently 6 weeks of this and she stopped her PPI therapy, I suspect GERD could be the cause.  Other etiologies must be considered and thus we will obtain chest imaging.  Consider QuantiFERON gold at follow-up.  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 No bleeding or ulcerations but given appearance and chronic nature of injury, may be candidate for pessary.   Consider discussion of DEXA and mammogram again at the next visit.     [name], MD  Family Medicine Teaching Service  [facility]

## 2021-07-23 NOTE — Assessment & Plan Note (Signed)
Repeat thyroid studies today. Will order ultrasound and schedule in March visit.

## 2021-07-23 NOTE — Patient Instructions (Addendum)
It was wonderful to see you today.  Please bring ALL of your medications with you to every visit.   Today we talked about:   I sent in a refill for your stomach medication  An x-ray was ordered for you---you do not need an appointment to have this completed.  I recommend going to [facility] [address] [place] OR [address] [place]   If the results are normal,I will send you a letter  I will call you with results if anything is abnormal    .I have referred you to Gastroenterology and Gynecology  to further evaluate your concern. If you do not received a phone call about this appointment within 2 weeks, please call our office back at [phone]. Ree Shay coordinates our referrals and can assist you in this.    ??? ????? ???? ????? ?????? ?????  ?? ??? ?????? ??????? ?? - ?? ????? ??? ???? ???????? ???.  ???? ??????? ??? [facility] [address] ????? ??????? ?????? ????????? ? ???? ????????? OR [address] [place]  ??? ???? ??????? ?????? ? ????? ?? ??????  ????? ?? ??????? ???????? ??? ??? ?? ??? ??? ?????   ??? ??? ??????? ??? ??? ????? ?????? ?????? ?????? ?????? ?????? ?????? ???? ????. ??? ?? ???? ?????? ?????? ??? ??? ?????? ?? ???? ??????? ? ????? ??????? ??????? ??? ????? [phone]. ???? ????? ???? ???????? ??????? ??????? ?? ???. laqad 'ursalat eubuat jadidat li'adwiat maeadatik tama talab al'ashieat alsiyniat lak - la tahtaj 'iilaa maweid liaistikmal dhalika. 'uwasiy bialdhahab 'iilaa [facility] [address] dabilyu winduf[address] 'afinut [facility] OR [address] [place] 'iidha kanat alnatayij tabieiatan , sa'ursil lak khtaban sa'atasil bik li'iiblaghik bialnatayij 'iidha kan 'ayu shay' ghayr tabieiin laqad qumt bi'iihalatik 'iilaa qism 'amrad aljihaz alhadmii wa'amrad [place]' litaqyim mushkilatik bishakl 'akbara. 'iidha lam tatalaqa mukalamat  hatifiat hawl hadha almaweid fi ghudun 'usbueayn , fayurjaa alaitisal bimaktabina ealaa alraqm [id number]. Alyson Reedy warnar Jillyn Hidden 'iihalatina wayumkinuha musaeadatuk fi dhalika.   Thank you for choosing [facility].   Please call [phone] with any questions about today's appointment.  Please be sure to schedule follow up at the front  desk before you leave today.   [name], MD  Family Medicine

## 2021-07-23 NOTE — Assessment & Plan Note (Signed)
Worsening, with red flag of vomiting. Referral to Gastroenterology, discussed importance of answering phone. Refilled PPI which helped to some degree. Return precautions given.

## 2021-07-24 ENCOUNTER — Encounter: Payer: Self-pay | Admitting: Family Medicine

## 2021-07-24 ENCOUNTER — Other Ambulatory Visit: Payer: Self-pay | Admitting: Family Medicine

## 2021-07-24 DIAGNOSIS — E041 Nontoxic single thyroid nodule: Secondary | ICD-10-CM

## 2021-07-24 LAB — CBC
Hematocrit: 36.5 % (ref 34.0–46.6)
Hemoglobin: 11.9 g/dL (ref 11.1–15.9)
MCH: 26.6 pg (ref 26.6–33.0)
MCHC: 32.6 g/dL (ref 31.5–35.7)
MCV: 82 fL (ref 79–97)
Platelets: 213 10*3/uL (ref 150–450)
RBC: 4.47 x10E6/uL (ref 3.77–5.28)
RDW: 14.5 % (ref 11.7–15.4)
WBC: 3.3 10*3/uL — ABNORMAL LOW (ref 3.4–10.8)

## 2021-07-24 LAB — TSH: TSH: 0.275 u[IU]/mL — ABNORMAL LOW (ref 0.450–4.500)

## 2021-07-25 ENCOUNTER — Encounter: Payer: Self-pay | Admitting: Family Medicine

## 2021-07-25 LAB — CYTOLOGY - PAP
Comment: NEGATIVE
Diagnosis: NEGATIVE
High risk HPV: NEGATIVE

## 2021-07-25 LAB — SPECIMEN STATUS REPORT

## 2021-07-25 LAB — T3: T3, Total: 108 ng/dL (ref 71–180)

## 2021-07-25 LAB — T4, FREE: Free T4: 1.39 ng/dL (ref 0.82–1.77)

## 2021-07-28 ENCOUNTER — Ambulatory Visit
Admission: RE | Admit: 2021-07-28 | Discharge: 2021-07-28 | Disposition: A | Discharge status: Home / Self Care | Payer: Medicaid Other | Source: Ambulatory Visit | Attending: Family Medicine | Admitting: Family Medicine

## 2021-07-28 ENCOUNTER — Other Ambulatory Visit: Payer: Self-pay

## 2021-07-28 ENCOUNTER — Encounter: Payer: Self-pay | Admitting: Family Medicine

## 2021-07-28 DIAGNOSIS — R052 Subacute cough: Secondary | ICD-10-CM

## 2021-08-08 ENCOUNTER — Other Ambulatory Visit: Payer: Self-pay

## 2021-08-08 NOTE — Patient Outreach (Signed)
Care Coordination  08/08/2021  Irvin Franchini [birth year] [id number]   Medicaid Managed Care   Unsuccessful Outreach Note  08/08/2021 Name: Emily Newman MRN: [id number] DOB: 24-Oct-1955  Referred by: Westley Chandler, MD Reason for referral : High Risk Managed Medicaid (Mm social work unsuccessful telephone outreach)   An unsuccessful telephone outreach was attempted today. The patient was referred to the case management team for assistance with care management and care coordination.   Follow Up Plan: The patient has been provided with contact information for the care management team and has been advised to call with any health related questions or concerns.   Gus Puma, BSW, [place] [facility]   [place]  High Risk Managed Medicaid Team  [phone]

## 2021-08-08 NOTE — Patient Instructions (Signed)
Visit Information  Ms. Emily Newman  - as a part of your Medicaid benefit, you are eligible for care management and care coordination services at no cost or copay. I was unable to reach you by phone today but would be happy to help you with your health related needs. Please feel free to call me @ [phone]     Gus Puma, BSW, [facility]   [facility]  High Risk Managed Medicaid Team  [phone]

## 2021-08-15 ENCOUNTER — Other Ambulatory Visit: Payer: Self-pay | Admitting: Family Medicine

## 2021-08-15 DIAGNOSIS — K219 Gastro-esophageal reflux disease without esophagitis: Secondary | ICD-10-CM

## 2021-08-15 MED ORDER — OMEPRAZOLE 40 MG PO CPDR: 40.0000 mg | DELAYED_RELEASE_CAPSULE | Freq: Every day | ORAL | 3 refills | Status: DC

## 2021-08-22 ENCOUNTER — Telehealth: Payer: Self-pay

## 2021-08-22 NOTE — Telephone Encounter (Signed)
Family requesting to schedule appointment. Appointment rescheduled for 09/08/21 at 09:30am. Patient sister made aware.  Nicole Cella Sisto Granillo RN BSn PCCN  [facility] [phone]-cell [phone]-office

## 2021-08-23 NOTE — Congregational Nurse Program (Signed)
°  Dept: [phone]   Congregational Nurse Program Note  Date of Encounter: 08/22/21  Past Medical History: Past Medical History:  Diagnosis Date   GERD (gastroesophageal reflux disease)    Hypertension    Vision impairment     Encounter Details:  CNP Questionnaire - 08/22/21 1545       Questionnaire   Do you give verbal consent to treat you today? Yes    Location Patient Served  [facility]    Visit Setting Home    Patient Status Refugee    Insurance [facility]    Insurance Referral N/A    Medication Have Medication Insecurities;Provided Medication Assistance;Referred to Medication Assistance    Medical Provider Yes    Screening Referrals N/A    Medical Referral N/A    Medical Appointment Made N/A    Food N/A    Transportation N/A    Housing/Utilities N/A    Interpersonal Safety N/A    Intervention Advocate;Navigate Healthcare System;Case Management;Counsel;Educate;Support    ED Visit Averted Yes    Life-Saving Intervention Made N/A           Home visit completed. Patient at home with her children. Medication reviewed,taking as ordered.Patient has separated the pills into two bottles labelled  differently. Advised to leave the medication in their original pill bottle to avoid confusion.  Patient is worried about her 67 year old old son who was recently admitted and discharged. I requested  to speak with him but he was sleeping.His siblings said that he does not like to be bothered while he is sleeping.  I will continue to offer support as needed.  Nicole Cella Raymont Andreoni RN BSn PCCN  [facility] [phone]-cell [phone]-office

## 2021-08-25 ENCOUNTER — Ambulatory Visit: Payer: Medicaid Other | Admitting: Family Medicine

## 2021-09-08 ENCOUNTER — Ambulatory Visit (INDEPENDENT_AMBULATORY_CARE_PROVIDER_SITE_OTHER): Payer: Medicaid Other | Admitting: Family Medicine

## 2021-09-08 ENCOUNTER — Encounter: Payer: Self-pay | Admitting: Family Medicine

## 2021-09-08 ENCOUNTER — Other Ambulatory Visit: Payer: Self-pay

## 2021-09-08 VITALS — BP 137/72 | HR 69 | Ht 62.99 in | Wt 142.0 lb

## 2021-09-08 DIAGNOSIS — I10 Essential (primary) hypertension: Secondary | ICD-10-CM | POA: Diagnosis not present

## 2021-09-08 DIAGNOSIS — K219 Gastro-esophageal reflux disease without esophagitis: Secondary | ICD-10-CM

## 2021-09-08 DIAGNOSIS — R053 Chronic cough: Secondary | ICD-10-CM

## 2021-09-08 DIAGNOSIS — Z780 Asymptomatic menopausal state: Secondary | ICD-10-CM | POA: Diagnosis not present

## 2021-09-08 DIAGNOSIS — E041 Nontoxic single thyroid nodule: Secondary | ICD-10-CM

## 2021-09-08 MED ORDER — OMEPRAZOLE 40 MG PO CPDR: 40.0000 mg | DELAYED_RELEASE_CAPSULE | Freq: Every day | ORAL | 3 refills | Status: DC

## 2021-09-08 NOTE — Assessment & Plan Note (Signed)
Near goal- continue current therapy  °

## 2021-09-08 NOTE — Assessment & Plan Note (Signed)
Repeat thyroid labs given previously suppressed TSH.  Thyroid ultrasound ordered and will schedule. ?

## 2021-09-08 NOTE — Progress Notes (Signed)
? ? ?  SUBJECTIVE:  ? ?CHIEF COMPLAINT: BP check  ?HPI:  ?The patient speaks [place] as their primary language.  An interpreter was used for the entire visit ([place] -> Arabic -> [place])  ? ?Emily Newman is a 66 y.o.  with history notable for hypertension and kyphosis  presenting for follow-up on her blood pressure and chronic cough.  The patient reports her cough is improved with PPI therapy.  She recently ran out and now her cough is returned.  The cough is only at night.  She does not have any burning in her mouth but does note some epigastric pain with this.  No hemoptysis, productive sputum, dyspnea, chest pain.  No orthopnea or PND. ? ?The patient reports she did not take any of her medications today.  She denies chest pain or headaches today. ? ?PERTINENT  PMH / PSH/Family/Social History : Updated and reviewed as appropriate ? ?OBJECTIVE:  ? ?BP (!) 147/84   Pulse 72   Ht 5' 2.99" (1.6 m)   Wt 142 lb (64.4 kg)   SpO2 100%   BMI 25.16 kg/m?   ?Today's weight:  ?Last Weight  Most recent update: 09/08/2021 11:05 AM  ? ? Weight  ?64.4 kg (142 lb)  ?      ? ?  ? ?Review of prior weights: ?Filed Weights  ? 09/08/21 1105  ?Weight: 142 lb (64.4 kg)  ? ?RRR ?Lungs clear ?Abdomen soft, non tender no epigastric tenderness  ?ASSESSMENT/PLAN:  ? ?Essential hypertension ?Near goal continue current therapy. ? ?Thyroid nodule ?Repeat thyroid labs given previously suppressed TSH.  Thyroid ultrasound ordered and will schedule. ? ?Chronic cough ?Suspect the cause of her cough is GERD.  No dyspnea orthopnea or PND suggestive of heart failure.  Additional causes also considered such as asthma.  Chest x-ray is unremarkable.  Her cough at night is improved with taking omeprazole.  This was refilled, she recently ran out.  Given her age and risk factors referral to gastroenterology.  If persistent is not improved consider CT of chest or pulmonary function test. ?  ?HCM ?Referral to GI  ?Ordered DEXA  ? ? ? ? ?Dorris Singh, MD  ?Family  Medicine Teaching Service  ?[place]  ? ? ?

## 2021-09-08 NOTE — Assessment & Plan Note (Signed)
Suspect the cause of her cough is GERD.  No dyspnea orthopnea or PND suggestive of heart failure.  Additional causes also considered such as asthma.  Chest x-ray is unremarkable.  Her cough at night is improved with taking omeprazole.  This was refilled, she recently ran out.  Given her age and risk factors referral to gastroenterology.  If persistent is not improved consider CT of chest or pulmonary function test. ?

## 2021-09-08 NOTE — Patient Instructions (Addendum)
It was wonderful to see you today. ? ?Please bring ALL of your medications with you to every visit.  ? ?Today we talked about: ?--Going to gastroenterology for your nighttime  coughing  ? ?--Getting a thyroid ultrasound  ? ?I recommend a bone density test. I ordered this test. This is to test the strength of your bones. It is like an x-ray. You need to call to schedule [phone]. This is located at [address] [facility] [address]---the same place mammograms are completed.  ? ? ? ?Thank you for choosing [facility].  ? ?Please call [phone] with any questions about today's appointment. ? ?Please be sure to schedule follow up at the front  desk before you leave today.  ? ?[name], MD  ?Family Medicine  ? ?

## 2021-09-09 ENCOUNTER — Encounter: Payer: Self-pay | Admitting: Family Medicine

## 2021-09-09 LAB — TSH+FREE T4
Free T4: 1.29 ng/dL (ref 0.82–1.77)
TSH: 0.416 u[IU]/mL — ABNORMAL LOW (ref 0.450–4.500)

## 2021-09-09 LAB — T3: T3, Total: 121 ng/dL (ref 71–180)

## 2021-09-10 ENCOUNTER — Telehealth: Payer: Self-pay | Admitting: Family Medicine

## 2021-09-10 NOTE — Telephone Encounter (Signed)
Called and left voicemail for [employer] at [facility].  ? ?[name], MD  ?Family Medicine Teaching Service  ? ?

## 2021-09-10 NOTE — Telephone Encounter (Signed)
Called and spoke with Yolanda at [facility]. ? ?[name], MD  ?Family Medicine Teaching Service  ? ?

## 2021-09-19 ENCOUNTER — Telehealth: Payer: Self-pay

## 2021-09-19 NOTE — Telephone Encounter (Signed)
Called patient's niece Allena Earing using [employer] Id # [phone], a message was left for patient concerning upcoming appointment. ? ?US Thyroid ?[facility] ?09/25/2021 ?1230 with arrival at 1215 ? ?Marland KitchenGlennie Hawk, CMA ? ?
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
## 2021-09-25 ENCOUNTER — Ambulatory Visit (HOSPITAL_COMMUNITY): Admission: RE | Admit: 2021-09-25 | Payer: Medicaid Other | Source: Ambulatory Visit

## 2021-09-25 ENCOUNTER — Telehealth: Payer: Self-pay

## 2021-09-25 ENCOUNTER — Telehealth: Payer: Self-pay | Admitting: Family Medicine

## 2021-09-25 DIAGNOSIS — Z59819 Housing instability, housed unspecified: Secondary | ICD-10-CM

## 2021-09-25 NOTE — Telephone Encounter (Signed)
Received a call from patient niece. Water has been cut off due to unpaid bills and seeking financial assistance.  Patient niece says that patient is willing to work because her current monthly check is not enough to pay all her bills. ?I will contact pcp and Child psychotherapist. I have also informed [facility]  ? ?Nicole Cella Azad Calame RN BSn PCCN  ?[facility] ?[phone]-cell ?[phone]-office ? ?

## 2021-09-25 NOTE — Telephone Encounter (Signed)
Referral to CCM as patient recently lost water to home. ? ?Dorris Singh, MD  ?Family Medicine Teaching Service  ? ?

## 2021-09-26 ENCOUNTER — Other Ambulatory Visit: Payer: Medicaid Other

## 2021-09-26 NOTE — Patient Outreach (Signed)
Care Coordination ? ?09/26/2021 ? ?[name] ?11/08/1955 ?[id number] ? ? ?Medicaid Managed Care  ? ?Unsuccessful Outreach Note ? ?09/26/2021 ?Name: Emily Newman MRN: [id number] DOB: 1956/03/05 ? ?Referred by: Westley Chandler, MD ?Reason for referral : High Risk Managed Medicaid (MM social work Unsuccessful Soil scientist) ? ? ?An unsuccessful telephone outreach was attempted today. The patient was referred to the case management team for assistance with care management and care coordination.  ? ?Follow Up Plan: The care management team will reach out to the patient again over the next 7 days.  ? ?Gus Puma, BSW, MHA ?[facility]  ?High Risk Managed Medicaid Team  ?[phone]  ?

## 2021-09-26 NOTE — Patient Instructions (Signed)
Visit Information  Ms. Nathania Cotham  - as a part of your Medicaid benefit, you are eligible for care management and care coordination services at no cost or copay. I was unable to reach you by phone today but would be happy to help you with your health related needs. Please feel free to call me @ [phone]).   A member of the Managed Medicaid care management team will reach out to you again over the next 7 days.   Majesta Leichter, BSW, MHA [facility]  [place]  High Risk Managed Medicaid Team  [phone]  

## 2021-10-07 ENCOUNTER — Other Ambulatory Visit: Payer: Self-pay

## 2021-10-07 NOTE — Patient Outreach (Signed)
Care Coordination ? ?10/07/2021 ? ?[name] ?[birth year] ?[id number] ? ? ?Medicaid Managed Care  ? ?Unsuccessful Outreach Note ? ?10/07/2021 ?Name: Emily Newman MRN: [id number] DOB: 06-30-1955 ? ?Referred by: Westley Chandler, MD ?Reason for referral : High Risk Managed Medicaid (MM Social Work unsuccessful telephone outreach) ? ? ?A second unsuccessful telephone outreach was attempted today. The patient was referred to the case management team for assistance with care management and care coordination.  ? ?Follow Up Plan: The care management team will reach out to the patient again over the next 7 days.  ? ?Gus Puma, BSW, MHA ?[facility]  ?High Risk Managed Medicaid Team  ?[phone]  ?

## 2021-10-07 NOTE — Patient Instructions (Signed)
Visit Information  Ms. Emily Newman  - as a part of your Medicaid benefit, you are eligible for care management and care coordination services at no cost or copay. I was unable to reach you by phone today but would be happy to help you with your health related needs. Please feel free to call me @ [phone]).   A member of the Managed Medicaid care management team will reach out to you again over the next 7 days.   Emily Newman, BSW, MHA [facility]  [place]  High Risk Managed Medicaid Team  [phone]  

## 2021-10-16 ENCOUNTER — Other Ambulatory Visit: Payer: Self-pay

## 2021-10-16 NOTE — Patient Outreach (Signed)
Care Coordination ? ?10/16/2021 ? ?Rei Sandate ?July 01, 1955 ?[id number] ? ?BSW contacted patient niece, she stated she did not know any information regarding resources needed. BSW contacted interpreter line for [place] and they stated they did not have an interpreter right now. BSW will try again.  ? ? ?Gus Puma, BSW, [place] ?[facility]  ?High Risk Managed Medicaid Team  ?[phone]  ?

## 2021-10-23 NOTE — Progress Notes (Signed)
? ? ?  SUBJECTIVE:  ? ?CHIEF COMPLAINT: cough and thyroid ultrasound  ?HPI:  ? ?Emily Newman is a 66 y.o.  with history notable for hypertension  presenting for follow up. ?The patient speaks [place] as their primary language.  An interpreter was used for the entire visit. Arabic interpreter used for niece ([name]). ? ?Overall, they report home is good--kids doing okay.  ? ?The patient reports her cough persists.  She reports a history of allergies in [place], resolved with pills. Does not like Flonase. No hemoptysis, dyspea, chest pain. No nausea, vomiting, LE edema. No weight loss. ? ?She has not yet undergone repeat ultrasound. No weight loss, no difficulty swallowing.   ? ?PERTINENT  PMH / PSH/Family/Social History : HTN, kyphosis, scoliosis, chronic cough  ? ?OBJECTIVE:  ? ?BP (!) 136/92   Pulse 96   Wt 140 lb (63.5 kg)   SpO2 99%   BMI 24.81 kg/m?   ?Today's weight:  ?Last Weight  Most recent update: 10/24/2021  8:30 AM  ? ? Weight  ?63.5 kg (140 lb)  ?      ? ?  ? ?Review of prior weights: ?Filed Weights  ? 10/24/21 0829  ?Weight: 140 lb (63.5 kg)  ? ?HEENT  ?R TM grayish, no bulging. + scar, L unremarkable ?Nares unremarkable no polyps ?No LAD ?Cardiac: Regular rate and rhythm. Normal S1/S2. No murmurs, rubs, or gallops appreciated. ?Lungs: Clear bilaterally to ascultation.  ?Psych: Pleasant and appropriate  ? ? ?ASSESSMENT/PLAN:  ? ?Essential hypertension ?At goal, continue current therapy.  ? ?Thyroid nodule ?Discussed importance, will reach out to CM to schedule.  ? ?Chronic cough ?Possibly due to allergic rhinitis, worsened over past two weeks. Discussed intranasal steroid, declined, start anti-histamine. ?Other etiologies include obstructive lung disease, restrictive lung disease (has prior exposures, scoliosis),  Less likely HF, malignancy (Stable weight and CXR negative)  ?Consider spirometry at follow up if not improved in summer months  ?  ?HCM ?Shingrix given  ? ? ? ?[name], MD  ?Family Medicine  Teaching Service  ?[facility]  ? ? ?

## 2021-10-24 ENCOUNTER — Encounter: Payer: Self-pay | Admitting: Family Medicine

## 2021-10-24 ENCOUNTER — Ambulatory Visit (INDEPENDENT_AMBULATORY_CARE_PROVIDER_SITE_OTHER): Payer: Medicaid Other | Admitting: Family Medicine

## 2021-10-24 ENCOUNTER — Other Ambulatory Visit: Payer: Self-pay

## 2021-10-24 VITALS — BP 136/92 | HR 96 | Wt 140.0 lb

## 2021-10-24 DIAGNOSIS — I10 Essential (primary) hypertension: Secondary | ICD-10-CM

## 2021-10-24 DIAGNOSIS — J301 Allergic rhinitis due to pollen: Secondary | ICD-10-CM

## 2021-10-24 DIAGNOSIS — E041 Nontoxic single thyroid nodule: Secondary | ICD-10-CM | POA: Diagnosis not present

## 2021-10-24 DIAGNOSIS — K219 Gastro-esophageal reflux disease without esophagitis: Secondary | ICD-10-CM

## 2021-10-24 DIAGNOSIS — R053 Chronic cough: Secondary | ICD-10-CM

## 2021-10-24 DIAGNOSIS — Z23 Encounter for immunization: Secondary | ICD-10-CM | POA: Diagnosis not present

## 2021-10-24 MED ORDER — OMEPRAZOLE 40 MG PO CPDR: 40.0000 mg | DELAYED_RELEASE_CAPSULE | Freq: Every day | ORAL | 3 refills | Status: DC

## 2021-10-24 MED ORDER — SHINGRIX 50 MCG/0.5ML IM SUSR: 0.5000 mL | INTRAMUSCULAR | 1 refills | Status: DC

## 2021-10-24 MED ORDER — LEVOCETIRIZINE DIHYDROCHLORIDE 5 MG PO TABS: 5.0000 mg | ORAL_TABLET | Freq: Every evening | ORAL | 3 refills | Status: DC

## 2021-10-24 NOTE — Assessment & Plan Note (Signed)
Possibly due to allergic rhinitis, worsened over past two weeks. Discussed intranasal steroid, declined, start anti-histamine. ?Other etiologies include obstructive lung disease, restrictive lung disease (has prior exposures, scoliosis),  Less likely HF, malignancy (Stable weight and CXR negative)  ?

## 2021-10-24 NOTE — Patient Instructions (Addendum)
It was wonderful to see you today. ? ?Please bring ALL of your medications with you to every visit.  ? ?Today we talked about: ? ?--I will reach out to [name] about thyroid ultrasound  ? ?-- Please pick up the allergy medicine at your pharmacy ? ?--You are due for a vaccine at the pharmacy--take the paper to the pharmacy  ? ?- ??????? ?? ????? ???? ??????? ??? ??????? ????? ??????? ? ?- ???? ?????? ??? ???? ???????? ?? ???????? ?????? ?? ? ?- ?? ?????? ?? ???? ??? ???? ?? ???????? - ?? ?????? ??? ???????? ?- sa'atawasal mae lurit bishan almawjat fawq alsawtiat lilghudat aldaraqia ?- yurjaa alhusul ealaa dawa' alhasaasiat min alsaydaliat alkhasat bik ?- min almuqarar 'an tahsul ealaa laqah fi alsaydaliat - khudh alwaraqat 'iilaa alsaydalia ? ? ?Thank you for choosing [facility].  ? ?Please call [phone] with any questions about today's appointment. ? ?Please be sure to schedule follow up at the front  desk before you leave today.  ? ?Dorris Singh, MD  ?Family Medicine   ?

## 2021-10-24 NOTE — Assessment & Plan Note (Signed)
At goal, continue current therapy  

## 2021-10-24 NOTE — Assessment & Plan Note (Signed)
Discussed importance, will reach out to CM to schedule.  ?

## 2021-10-30 ENCOUNTER — Encounter: Payer: Self-pay | Admitting: Family Medicine

## 2021-11-11 ENCOUNTER — Telehealth: Payer: Self-pay | Admitting: Family Medicine

## 2021-11-11 NOTE — Telephone Encounter (Signed)
[employer] called regarding the ultrasound this patient is supposed to be getting of her thyroid. Please call patient back to get this scheduled.  ?

## 2021-11-13 NOTE — Telephone Encounter (Signed)
Called and left voice message for [name] Azine at [phone], informing her of upcoming appointment for patient.  Thyroid Ultrasound 11/17/2021 [place] [id number] with an arrival of 93  .Ozella Almond, CMA

## 2021-11-13 NOTE — Telephone Encounter (Signed)
Please let CWS case work Production designer, theatre/television/film know  Number Phone: [phone]  Please let me know if questions [name], MD  [facility] Medicine Teaching Service

## 2021-11-13 NOTE — Telephone Encounter (Signed)
Rescheduled x 2 appointment.  Thyroid Ultrasound Zac[facility] Pontes 11/17/2021 1030 with arrival 1015  Called patient's niece [name] at [phone] and left message as no one answered.  Used [employer] Rem # [id number]. Left detailed message with number to reschedule if the appointment is inconvenient.  Of note:  [facility]  called regarding ultrasound but no number was in patient's chart.  Ozella Almond, [place]

## 2021-11-17 ENCOUNTER — Ambulatory Visit (HOSPITAL_COMMUNITY)
Admission: RE | Admit: 2021-11-17 | Discharge: 2021-11-17 | Disposition: A | Discharge status: Home / Self Care | Payer: Medicaid Other | Source: Ambulatory Visit | Attending: Family Medicine | Admitting: Family Medicine

## 2021-11-17 DIAGNOSIS — E041 Nontoxic single thyroid nodule: Secondary | ICD-10-CM | POA: Insufficient documentation

## 2021-11-18 ENCOUNTER — Encounter: Payer: Self-pay | Admitting: Family Medicine

## 2021-12-02 ENCOUNTER — Telehealth: Payer: Self-pay | Admitting: Family Medicine
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
## 2021-12-09 ENCOUNTER — Other Ambulatory Visit: Payer: Self-pay

## 2021-12-09 NOTE — Patient Outreach (Signed)
Care Coordination  12/09/2021  Emily Newman 09-04-1955 [id number]   Medicaid Managed Care   Unsuccessful Outreach Note  12/09/2021 Name: Emily Newman MRN: [id number] DOB: 11-Feb-1956  Referred by: Westley Chandler, MD Reason for referral : High Risk Managed Medicaid (MM social work unsuccessful telephone outreach)   An unsuccessful telephone outreach was attempted today. The patient was referred to the case management team for assistance with care management and care coordination.   Follow Up Plan: The care management team will reach out to the patient again over the next 7 days.   Gus Puma, BSW, [place] [facility]  [place]  High Risk Managed Medicaid Team  [phone]

## 2021-12-09 NOTE — Patient Instructions (Signed)
Visit Information  Ms. Emily Newman  - as a part of your Medicaid benefit, you are eligible for care management and care coordination services at no cost or copay. I was unable to reach you by phone today but would be happy to help you with your health related needs. Please feel free to call me @ [phone]).   A member of the Managed Medicaid care management team will reach out to you again over the next 7 days.   Tray Klayman, BSW, MHA [facility]  [place]  High Risk Managed Medicaid Team  [phone]  

## 2021-12-15 ENCOUNTER — Encounter: Payer: Self-pay | Admitting: Family Medicine

## 2021-12-19 ENCOUNTER — Encounter: Payer: Medicaid Other | Admitting: Family Medicine

## 2022-01-02 ENCOUNTER — Ambulatory Visit (INDEPENDENT_AMBULATORY_CARE_PROVIDER_SITE_OTHER): Payer: Medicaid Other | Admitting: Family Medicine

## 2022-01-02 ENCOUNTER — Encounter: Payer: Self-pay | Admitting: Family Medicine

## 2022-01-02 VITALS — BP 153/93 | HR 87 | Wt 138.2 lb

## 2022-01-02 DIAGNOSIS — J301 Allergic rhinitis due to pollen: Secondary | ICD-10-CM | POA: Diagnosis not present

## 2022-01-02 DIAGNOSIS — I10 Essential (primary) hypertension: Secondary | ICD-10-CM

## 2022-01-02 DIAGNOSIS — Z23 Encounter for immunization: Secondary | ICD-10-CM | POA: Diagnosis not present

## 2022-01-02 DIAGNOSIS — E041 Nontoxic single thyroid nodule: Secondary | ICD-10-CM

## 2022-01-02 DIAGNOSIS — K219 Gastro-esophageal reflux disease without esophagitis: Secondary | ICD-10-CM

## 2022-01-02 MED ORDER — SHINGRIX 50 MCG/0.5ML IM SUSR: 0.5000 mL | INTRAMUSCULAR | 1 refills | Status: DC

## 2022-01-02 MED ORDER — OMEPRAZOLE 40 MG PO CPDR: 40.0000 mg | DELAYED_RELEASE_CAPSULE | Freq: Every day | ORAL | 3 refills | Status: DC

## 2022-01-02 MED ORDER — AMLODIPINE BESYLATE 10 MG PO TABS: 10.0000 mg | ORAL_TABLET | Freq: Every day | ORAL | 3 refills | Status: DC

## 2022-01-02 MED ORDER — LEVOCETIRIZINE DIHYDROCHLORIDE 5 MG PO TABS: 5.0000 mg | ORAL_TABLET | Freq: Every evening | ORAL | 3 refills | Status: DC
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
## 2022-01-02 NOTE — Progress Notes (Deleted)
    SUBJECTIVE:   CHIEF COMPLAINT: *** HPI:   Emily Newman is a 66 y.o.  with history notable for *** presenting for ***.   PERTINENT  PMH / PSH/Family/Social History : ***  OBJECTIVE:   There were no vitals taken for this visit.  Today's weight:  Review of prior weights: There were no vitals filed for this visit.  ***  ASSESSMENT/PLAN:   No problem-specific Assessment & Plan notes found for this encounter.     {    This will disappear when note is signed, click to select method of visit    :1}  [name], MD  Family Medicine Teaching Service  [facility]

## 2022-01-02 NOTE — Patient Instructions (Addendum)
It was great seeing you today!  Today we talked about:  - Your blood pressure was high today in clinic. We are increasing your blood pressure pill to 10 mg daily to better control your blood pressure. Continue to take your blood pressure pill every night.  - I am glad you are feeling safe at home! Please let me know if you ever begin to feel unsafe at home.  - I will reach out to your case manager about further resources.    ??? ??? ??? ??????? ????? ?? ???????. ???? ?????? ???? ??? ???? ??? 10 ??? ?????? ?????? ???? ???? ?? ??? ????. ????? ?? ????? ???? ??? ???? ?? ????.  - ??? ???? ???? ???? ??????? ?? ??????! ???? ?????? ??? ???? ???? ???? ?????? ?? ??????.     - ??????? ?? ???? ????? ???? ?????? ?? ???????. - kan daght damik mrtfean alyawm fi aleiadati. naqum biziadat hubub daght aldam 'iilaa 10 milgh ywmyan liltahakum bishakl 'afdal fi daght aldam. aistamara fi tanawul hubub daght aldam kula laylatin. - 'ana saeid li'anak tasheur bial'aman fi almanzili! yurjaa 'iielami 'iidha bada'at tasheur bieadam al'aman fi almanzili. - sa'atawasal mae mudir halatik bishan almazid min almawaridi.  Remember to please bring ALL of your medications with you to every visit.   Please follow up on February 06, 2022 at 11:10am.  Thank you for choosing [facility]! Please call [phone] with any questions about today's appointment. Please schedule your follow up at the front desk before you leave today.   [name], MS2

## 2022-01-02 NOTE — Assessment & Plan Note (Signed)
Her BP today is elevated at 153/93, not at goal. She reports compliance with her medication. - Will increase amlodipine to 10 mg daily to improve BP control.
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
## 2022-01-03 ENCOUNTER — Encounter: Payer: Self-pay | Admitting: Family Medicine

## 2022-01-03 LAB — BASIC METABOLIC PANEL
BUN/Creatinine Ratio: 13 (ref 12–28)
BUN: 10 mg/dL (ref 8–27)
CO2: 24 mmol/L (ref 20–29)
Calcium: 9.1 mg/dL (ref 8.7–10.3)
Chloride: 102 mmol/L (ref 96–106)
Creatinine, Ser: 0.76 mg/dL (ref 0.57–1.00)
Glucose: 95 mg/dL (ref 70–99)
Potassium: 4.6 mmol/L (ref 3.5–5.2)
Sodium: 138 mmol/L (ref 134–144)
eGFR: 86 mL/min/{1.73_m2} (ref >59)

## 2022-01-03 LAB — TSH RFX ON ABNORMAL TO FREE T4: TSH: 0.296 u[IU]/mL — ABNORMAL LOW (ref 0.450–4.500)

## 2022-01-03 LAB — T4F: T4,Free (Direct): 1.42 ng/dL (ref 0.82–1.77)

## 2022-01-09 ENCOUNTER — Telehealth: Payer: Self-pay

## 2022-01-09 DIAGNOSIS — Z0289 Encounter for other administrative examinations: Secondary | ICD-10-CM

## 2022-01-09 DIAGNOSIS — J301 Allergic rhinitis due to pollen: Secondary | ICD-10-CM

## 2022-01-09 DIAGNOSIS — Z59819 Housing instability, housed unspecified: Secondary | ICD-10-CM

## 2022-01-09 DIAGNOSIS — H547 Unspecified visual loss: Secondary | ICD-10-CM

## 2022-01-09 DIAGNOSIS — I10 Essential (primary) hypertension: Secondary | ICD-10-CM

## 2022-01-09 DIAGNOSIS — K219 Gastro-esophageal reflux disease without esophagitis: Secondary | ICD-10-CM

## 2022-01-09 MED ORDER — AMLODIPINE BESYLATE 10 MG PO TABS: 10.0000 mg | ORAL_TABLET | Freq: Every day | ORAL | 3 refills | Status: DC

## 2022-01-09 MED ORDER — LEVOCETIRIZINE DIHYDROCHLORIDE 5 MG PO TABS: 5.0000 mg | ORAL_TABLET | Freq: Every evening | ORAL | 3 refills | Status: DC

## 2022-01-09 MED ORDER — OMEPRAZOLE 40 MG PO CPDR: 40.0000 mg | DELAYED_RELEASE_CAPSULE | Freq: Every day | ORAL | 3 refills | Status: DC

## 2022-01-09 NOTE — Telephone Encounter (Signed)
Case Manager calls nurse line in regards to medications.   She reports the patient has not picked up 7/7 prescriptions. Amlodipine, Omeprazole or Xyzal. She requests a pharmacy change that is closer to patients home and CM will pick up for patient.   I called [facility] to have prescriptions transferred, however pharmacist reported all 3 medications were picked up on 7/8.  I attempted to call Lorette back, however I had to leave a detailed VM.   In addition, Lorette is requesting a CCM referral to help patient get set up with a pharmacy that will deliver prescriptions directly to her home.   Will forward to PCP.

## 2022-01-09 NOTE — Telephone Encounter (Signed)
Medication sent to [facility] to be mailed to house.  I have placed another CCM referral per CM.  Nursing please call [name] and let her know above information.We had a DPR on file for CWS ([name] works there).   [name], MD  Family Medicine Teaching Service

## 2022-01-26 ENCOUNTER — Ambulatory Visit: Payer: Self-pay

## 2022-01-27 ENCOUNTER — Other Ambulatory Visit: Payer: Self-pay

## 2022-01-29 ENCOUNTER — Other Ambulatory Visit: Payer: Self-pay

## 2022-01-29 NOTE — Patient Outreach (Signed)
Care Coordination  01/29/2022  [name] 04-07-1956 [id number]   Medicaid Managed Care   Unsuccessful Outreach Note  01/29/2022 Name: Emily Newman MRN: [id number] DOB: 04-Aug-1955  Referred by: Westley Chandler, MD Reason for referral : High Risk Managed Medicaid (MM Social Work Unsuccessful telephone outreach)   An unsuccessful telephone outreach was attempted today. The patient was referred to the case management team for assistance with care management and care coordination.   Follow Up Plan: The care management team will reach out to the patient again over the next 7 days.   Gus Puma, BSW, [place] [facility]  [place]  High Risk Managed Medicaid Team  [phone]

## 2022-01-29 NOTE — Patient Instructions (Signed)
Visit Information  Ms. Emily Newman  - as a part of your Medicaid benefit, you are eligible for care management and care coordination services at no cost or copay. I was unable to reach you by phone today but would be happy to help you with your health related needs. Please feel free to call me @ [phone]).   A member of the Managed Medicaid care management team will reach out to you again over the next 7 days.   [name], BSW, MHA [facility]  [place]  High Risk Managed Medicaid Team  [phone]  

## 2022-02-06 ENCOUNTER — Encounter: Payer: Self-pay | Admitting: Family Medicine

## 2022-02-06 ENCOUNTER — Other Ambulatory Visit: Payer: Self-pay

## 2022-02-06 ENCOUNTER — Ambulatory Visit (INDEPENDENT_AMBULATORY_CARE_PROVIDER_SITE_OTHER): Payer: Medicaid Other | Admitting: Family Medicine

## 2022-02-06 VITALS — BP 130/83 | HR 91 | Wt 149.0 lb

## 2022-02-06 DIAGNOSIS — I10 Essential (primary) hypertension: Secondary | ICD-10-CM

## 2022-02-06 DIAGNOSIS — Z59819 Housing instability, housed unspecified: Secondary | ICD-10-CM

## 2022-02-06 MED ORDER — AMLODIPINE-OLMESARTAN 5-20 MG PO TABS: 1.0000 | ORAL_TABLET | Freq: Every day | ORAL | 3 refills | Status: DC

## 2022-02-06 NOTE — Progress Notes (Signed)
    SUBJECTIVE:   CHIEF COMPLAINT: knee pain   HPI:   Loriene Canada is a 66 y.o.  with history notable for essential HTN presenting for follow-up.   She reports since her last her knee pain has worsened. In clarifying, this is bilateral leg swelling. Does not have voltaren at home. Denies recent falls, dyspnea, chest pain. Reports compliance with medication, taking it last night.   She received the Shingrix vaccine   She reports that she no longer has food insecurity. She did get her power and water cut off. [name] helped her get these back. She does worry about this.  The patient cannot work due to knee OA (history of falls at work), HTN, and kyphosis. She has three children at home. [name] has not been in or near home. She is not sure how he is doing.   Visit conducted with video Arabic interpreter.   PERTINENT  PMH / PSH/Family/Social History : HTN, thyroid nodules   OBJECTIVE:   BP 130/83   Pulse 91   Wt 149 lb (67.6 kg)   SpO2 98%   BMI 26.40 kg/m   Today's weight:  Last Weight  Most recent update: 02/06/2022  9:30 AM    Weight  67.6 kg (149 lb)            Review of prior weights: [employer]   02/06/22 0930  Weight: 149 lb (67.6 kg)    Cardiac: Regular rate and rhythm. Normal S1/S2. No murmurs, rubs, or gallops appreciated. Lungs: Clear bilaterally to ascultation.  Psych: Pleasant and appropriate  Bilateral 1+ pitting edema to mid-shin    ASSESSMENT/PLAN:   Essential hypertension Stop amlodipine. Start CCB- ARB combo. Discussed with patient, information given in Arabic. Will message [name].    Housing insecurity  - Will refer back to CCM - Discussed with patient - Will also message [name]--- Marena Chancy is already involved     [name], MD  Family Medicine Teaching Service  [facility]

## 2022-02-06 NOTE — Progress Notes (Deleted)
    SUBJECTIVE:   CHIEF COMPLAINT: *** HPI:   Emily Newman is a 66 y.o.  with history notable for essential HTN presenting for follow-up.     PERTINENT  PMH / PSH/Family/Social History : ***  OBJECTIVE:   BP (!) 142/83   Pulse 91   Wt 149 lb (67.6 kg)   SpO2 98%   BMI 26.40 kg/m   Today's weight:  Last Weight  Most recent update: 02/06/2022  9:30 AM    Weight  67.6 kg (149 lb)            Review of prior weights: Filed Weights   02/06/22 0930  Weight: 149 lb (67.6 kg)    ***  ASSESSMENT/PLAN:   No problem-specific Assessment & Plan notes found for this encounter.     {    This will disappear when note is signed, click to select method of visit    :1}  [name], MD  Family Medicine Teaching Service  [facility]

## 2022-02-06 NOTE — Patient Instructions (Signed)
It was wonderful to see you today.  Please bring ALL of your medications with you to every visit.   Today we talked about:  - Stop 10 mg amlodipine as this has caused swelling - I sent in a new medication that will be delivered to your house  Please bring your pills to follow up   ???? 10 ??? ????????? ??? ??? ?? ???? ?? ???? - ??? ????? ????? ?????? ????? ??? ?????  ???? ????? ?????? ???????? 'uwqif 10 milagh 'amludibin li'ana hadha qad tasabab fi tawarum - laqad 'ursilt dwa'an jdydan sayasiluk 'iilaa manzilik yurjaa 'iihdar 'aqrasik lilmutabaea  Please follow up in 1 months   Thank you for choosing [facility].   Please call [phone] with any questions about today's appointment.  Please be sure to schedule follow up at the front  desk before you leave today.   [name], MD  Family Medicine

## 2022-02-06 NOTE — Assessment & Plan Note (Signed)
Stop amlodipine. Start CCB- ARB combo. Discussed with patient, information given in Arabic. Will message [name].

## 2022-02-09 ENCOUNTER — Other Ambulatory Visit: Payer: Self-pay

## 2022-02-09 NOTE — Patient Instructions (Signed)
Visit Information  Ms. Emily Newman was given information about Medicaid Managed Care team care coordination services as a part of their [facility] Medicaid benefit. Emily Newman verbally consented to engagement with the [facility] Managed Care team.   If you are experiencing a medical emergency, please call 911 or report to your local emergency department or urgent care.   If you have a non-emergency medical problem during routine business hours, please contact your provider's office and ask to speak with a nurse.   For questions related to your [facility] health plan, please call: [phone] or go here:[email or web address]  If you would like to schedule transportation through your [facility] plan, please call the following number at least 2 days in advance of your appointment: [phone].  You can also use the MTM portal or MTM mobile app to manage your rides. For the portal, please go to [email or web address]  Call the [facility] Crisis Line at [phone], at any time, 24 hours a day, 7 days a week. If you are in danger or need immediate medical attention call 911.  If you would like help to quit smoking, call 1-800-QUIT-NOW (901-506-9645) OR Espaol: 1-855-Djelo-Ya (2-637-858-8502) o para ms informacin haga clic aqu or Text READY to 774-128 to register via text  Ms. Emily Newman - following are the goals we discussed in your visit today:   Goals Addressed   None       Social Worker will follow up in 7 days .   Emily Newman, Emily Newman, [place] [facility]  [place]  High Risk Managed Medicaid Team  [phone]   Following is a copy of your plan of care:  There are no care plans that you recently modified to display for this patient.

## 2022-02-09 NOTE — Patient Outreach (Signed)
Medicaid Managed Care Social Work Note  02/09/2022 Name:  Zahraa Bhargava MRN:  [id number] DOB:  1955-08-09  Emily Newman is an 66 y.o. year old female who is a primary patient of Westley Chandler, MD.  The Medicaid Managed Care Coordination team was consulted for assistance with:  Community Resources   Ms. Berlanga was given information about Medicaid Managed Care Coordination team services today. Keita Zaun  [name]  agreed to services and verbal consent obtained.  Engaged with patient  for by telephone forinitial visit in response to referral for case management and/or care coordination services.   Assessments/Interventions:  Review of past medical history, allergies, medications, health status, including review of consultants reports, laboratory and other test data, was performed as part of comprehensive evaluation and provision of chronic care management services.  SDOH: (Social Determinant of Health) assessments and interventions performed: BSW completed a telephone outreach to patients niece and received no answer, BSW contacted patients CSW [name]. She stated patients water was cut off and her agency was able to assist with getting the water turned back on. Her electricity was never turned off, She did reach out to DSS and a few other resources but no one had any funds to assist. [name] did complete an application for housing with [facility], patient is on the waitlist. BSW did contact [place] [facility] and spoke wit [place]. She stated she has spoken with patients CSW and informed her to have the patient keep an eye on her mail, they will be sending her something soon. Advanced Directives Status:  Not addressed in this encounter.  Care Plan                 No Known Allergies  Medications Reviewed Today     Reviewed by Westley Chandler, MD (Physician) on 02/06/22 at 1025  Med List Status: <None>   Medication Order Taking? Sig Documenting Provider Last Dose Status Informant    Discontinued  03/28/21 0935   amLODipine-olmesartan (AZOR) 5-20 MG tablet [id number] Yes Take 1 tablet by mouth daily. Westley Chandler, MD Taking Active   diclofenac Sodium (VOLTAREN) 1 % GEL [id number] Yes Apply four times daily to knee Westley Chandler, MD Taking Active   levocetirizine (XYZAL ALLERGY 24HR) 5 MG tablet [id number] Yes Take 1 tablet (5 mg total) by mouth every evening. Westley Chandler, MD Taking Active   omeprazole (PRILOSEC) 40 MG capsule [id number] Yes Take 1 capsule (40 mg total) by mouth daily. Westley Chandler, MD Taking Active   triamcinolone ointment (KENALOG) 0.5 % [id number]  Apply 1 application topically daily.  Patient not taking: Reported on 01/02/2022   Westley Chandler, MD  Active   Zoster Vaccine Adjuvanted [facility]) injection [id number] No Inject 0.5 mLs into the muscle every 2 (two) months.  Patient not taking: Reported on 02/06/2022   Westley Chandler, MD Not Taking Active             Patient Active Problem List   Diagnosis Date Noted   Osteoarthritis of right knee 04/05/2021   Essential hypertension 07/17/2020   Vision impairment 07/17/2020   Thyroid nodule 07/17/2020   Refugee health examination 07/16/2020    Conditions to be addressed/monitored per PCP order:   housing   There are no care plans that you recently modified to display for this patient.   Follow up:  Patient agrees to Care Plan and Follow-up.  Plan: The Managed Medicaid care management team will reach out to the patient again  over the next 7 days.  Date/time of next scheduled Social Work care management/care coordination outreach:  02/18/22  Gus Puma, Kenard Gower, [facility]  [facility]  High Risk Managed Medicaid Team  [phone]

## 2022-02-17 ENCOUNTER — Other Ambulatory Visit: Payer: Self-pay | Admitting: *Deleted

## 2022-02-17 NOTE — Patient Outreach (Signed)
Care Coordination  02/17/2022  Emily Newman [birth year] [id number]   Medicaid Managed Care   Unsuccessful Outreach Note  02/17/2022 Name: [name] MRN: [id number] DOB: [birth year]  Referred by: Westley Chandler, MD Reason for referral : High Risk Managed Medicaid (Unsuccessful RNCM initial telephone outreach)   An unsuccessful telephone outreach was attempted today. The patient was referred to the case management team for assistance with care management and care coordination.   Call made while utilizing Arabic Interpreter [phone] via [employer].  Follow Up Plan: A HIPAA compliant phone message was left for the patient providing contact information and requesting a return call.   Estanislado Emms RN, BSN [name]  [facility]

## 2022-02-17 NOTE — Patient Instructions (Signed)
Visit Information  Ms. Deirdra Rhine  - as a part of your Medicaid benefit, you are eligible for care management and care coordination services at no cost or copay. I was unable to reach you by phone today but would be happy to help you with your health related needs. Please feel free to call me @ [phone].   A member of the Managed Medicaid care management team will reach out to you again over the next 14 days.   Gregg Winchell RN, BSN [place]  [facility] RN Care Coordinator   

## 2022-02-18 ENCOUNTER — Other Ambulatory Visit: Payer: Self-pay

## 2022-02-18 NOTE — Patient Outreach (Signed)
  Medicaid Managed Care Social Work Note  02/18/2022 Name:  Emily Newman MRN:  [id number] DOB:  10-Mar-1956  Emily Newman is an 66 y.o. year old female who is a primary patient of Emily Chandler, MD.  The Medicaid Managed Care Coordination team was consulted for assistance with:   Housing   Ms. Westerfield was given information about Medicaid Managed Care Coordination team services today. Adelheid Meints Patient and CSW  agreed to services and verbal consent obtained.  Engaged with patient  for by telephone forfollow up visit in response to referral for case management and/or care coordination services.   Assessments/Interventions:  Review of past medical history, allergies, medications, health status, including review of consultants reports, laboratory and other test data, was performed as part of comprehensive evaluation and provision of chronic care management services.  SDOH: (Social Determinant of Health) assessments and interventions performed: BSW completed a follow up call with patients caseworker, she stated patient still has not received anything from [place] [facility], she just checked her mail earlier in the week. No other resources are needed at this time.  Advanced Directives Status:  Not addressed in this encounter.  Care Plan                 No Known Allergies  Medications Reviewed Today     Reviewed by Emily Chandler, MD (Physician) on 02/06/22 at 1025  Med List Status: <None>   Medication Order Taking? Sig Documenting Provider Last Dose Status Informant    Discontinued 03/28/21 0935   amLODipine-olmesartan (AZOR) 5-20 MG tablet [id number] Yes Take 1 tablet by mouth daily. Emily Chandler, MD Taking Active   diclofenac Sodium (VOLTAREN) 1 % GEL [id number] Yes Apply four times daily to knee Emily Chandler, MD Taking Active   levocetirizine (XYZAL ALLERGY 24HR) 5 MG tablet [id number] Yes Take 1 tablet (5 mg total) by mouth every evening. Emily Chandler, MD Taking Active   omeprazole (PRILOSEC)  40 MG capsule [id number] Yes Take 1 capsule (40 mg total) by mouth daily. Emily Chandler, MD Taking Active   triamcinolone ointment (KENALOG) 0.5 % [id number]  Apply 1 application topically daily.  Patient not taking: Reported on 01/02/2022   Emily Chandler, MD  Active   Zoster Vaccine Adjuvanted [facility]) injection [id number] No Inject 0.5 mLs into the muscle every 2 (two) months.  Patient not taking: Reported on 02/06/2022   Emily Chandler, MD Not Taking Active             Patient Active Problem List   Diagnosis Date Noted   Osteoarthritis of right knee 04/05/2021   Essential hypertension 07/17/2020   Vision impairment 07/17/2020   Thyroid nodule 07/17/2020   Refugee health examination 07/16/2020    Conditions to be addressed/monitored per PCP order:   Housing   There are no care plans that you recently modified to display for this patient.   Follow up:  Patient agrees to Care Plan and Follow-up.  Plan: The Managed Medicaid care management team will reach out to the patient again over the next 30 days.  Date/time of next scheduled Social Work care management/care coordination outreach:  03/20/22  Gus Puma, Kenard Gower, [facility]  [facility]  High Risk Managed Medicaid Team  [phone]

## 2022-02-18 NOTE — Patient Instructions (Signed)
Visit Information  Ms. Langelier was given information about Medicaid Managed Care team care coordination services as a part of their [facility] Medicaid benefit. Braden Gover verbally consented to engagement with the [facility] Managed Care team.   If you are experiencing a medical emergency, please call 911 or report to your local emergency department or urgent care.   If you have a non-emergency medical problem during routine business hours, please contact your provider's office and ask to speak with a nurse.   For questions related to your [facility] health plan, please call: [phone] or go here:[email or web address]  If you would like to schedule transportation through your [facility] plan, please call the following number at least 2 days in advance of your appointment: [phone].  You can also use the MTM portal or MTM mobile app to manage your rides. For the portal, please go to [email or web address]  Call the [facility] Crisis Line at [phone], at any time, 24 hours a day, 7 days a week. If you are in danger or need immediate medical attention call 911.  If you would like help to quit smoking, call 1-800-QUIT-NOW (216-290-2808) OR Espaol: 1-855-Djelo-Ya (8-485-927-6394) o para ms informacin haga clic aqu or Text READY to 320-037 to register via text  Ms. Meckler - following are the goals we discussed in your visit today:   Goals Addressed   None      Social Worker will follow up in 30 days .   Gus Puma, BSW, [place] [facility]  [place]  High Risk Managed Medicaid Team  [phone]   Following is a copy of your plan of care:  There are no care plans that you recently modified to display for this patient.

## 2022-02-20 ENCOUNTER — Encounter: Payer: Medicaid Other | Admitting: Family Medicine

## 2022-02-20 NOTE — Progress Notes (Unsigned)
    SUBJECTIVE:   CHIEF COMPLAINT: Hypertension follow up  HPI:   Emily Newman is a 66 y.o.  with history notable for kyphoscoliosis and HTN presenting for BP follow  up. The patient speaks Arabic as their primary language.  An interpreter was used for the entire visit.    PERTINENT  PMH / PSH/Family/Social History : ***  OBJECTIVE:   There were no vitals taken for this visit.  Today's weight:  Review of prior weights: There were no vitals filed for this visit.  ***  ASSESSMENT/PLAN:   No problem-specific Assessment & Plan notes found for this encounter.     {    This will disappear when note is signed, click to select method of visit    :1}  [name], MD  Family Medicine Teaching Service  [facility]

## 2022-03-06 ENCOUNTER — Other Ambulatory Visit: Payer: Self-pay | Admitting: *Deleted

## 2022-03-06 NOTE — Patient Instructions (Signed)
Visit Information  Ms. Emily Newman  - as a part of your Medicaid benefit, you are eligible for care management and care coordination services at no cost or copay. I was unable to reach you by phone today but would be happy to help you with your health related needs. Please feel free to call me @ [phone].   A member of the Managed Medicaid care management team will reach out to you again over the next 14 days.   Estanislado Emms RN, BSN [place]  [facility]

## 2022-03-06 NOTE — Patient Outreach (Signed)
  Medicaid Managed Care   Unsuccessful Attempt Note   03/06/2022 Name: Emily Newman MRN: [id number] DOB: 1956/03/31  Referred by: Westley Chandler, MD Reason for referral : High Risk Managed Medicaid (Unsuccessful RNCM initial telephone outreach)   A second unsuccessful telephone outreach was attempted today. The patient was referred to the case management team for assistance with care management and care coordination.    Call made while utilizing Arabic interpreter [phone], via [employer], [employer] not available at this time. RNCM attempted to reach the patient, her niece and Ecologist).   Follow Up Plan: A HIPAA compliant phone message was left for the patient providing contact information and requesting a return call.    Estanislado Emms RN, BSN [place]  [facility]

## 2022-03-09 ENCOUNTER — Telehealth: Payer: Self-pay | Admitting: Family Medicine

## 2022-03-09 NOTE — Telephone Encounter (Signed)
..   Medicaid Managed Care   Unsuccessful Outreach Note  03/09/2022 Name: Emily Newman MRN: [id number] DOB: [birth year]  Referred by: Westley Chandler, MD Reason for referral : High Risk Managed Medicaid (I called [name] Azine to get this patient rescheduled with the MM RNCM. I left my name and number on her VM.)   Third unsuccessful telephone outreach was attempted today. The patient was referred to the case management team for assistance with care management and care coordination. The patient's primary care provider has been notified of our unsuccessful attempts to make or maintain contact with the patient. The care management team is pleased to engage with this patient at any time in the future should he/she be interested in assistance from the care management team.   Follow Up Plan: We have been unable to make contact with the patient for follow up. The care management team is available to follow up with the patient after provider conversation with the patient regarding recommendation for care management engagement and subsequent re-referral to the care management team.   Weston Settle Care Guide, High Risk Medicaid Managed Care Embedded Care Coordination [facility]  [facility]    SIGNATURE

## 2022-03-13 ENCOUNTER — Encounter: Payer: Self-pay | Admitting: Family Medicine

## 2022-03-13 ENCOUNTER — Ambulatory Visit (INDEPENDENT_AMBULATORY_CARE_PROVIDER_SITE_OTHER): Payer: Medicaid Other | Admitting: Family Medicine

## 2022-03-13 VITALS — BP 139/79 | HR 100 | Wt 141.0 lb

## 2022-03-13 DIAGNOSIS — I10 Essential (primary) hypertension: Secondary | ICD-10-CM | POA: Diagnosis not present

## 2022-03-13 DIAGNOSIS — R7989 Other specified abnormal findings of blood chemistry: Secondary | ICD-10-CM

## 2022-03-13 DIAGNOSIS — J301 Allergic rhinitis due to pollen: Secondary | ICD-10-CM

## 2022-03-13 DIAGNOSIS — M1711 Unilateral primary osteoarthritis, right knee: Secondary | ICD-10-CM | POA: Diagnosis not present

## 2022-03-13 MED ORDER — FLUTICASONE PROPIONATE 50 MCG/ACT NA SUSP: 2.0000 | Freq: Every day | NASAL | 6 refills | Status: DC

## 2022-03-13 NOTE — Assessment & Plan Note (Signed)
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

## 2022-03-13 NOTE — Progress Notes (Signed)
    SUBJECTIVE:   CHIEF COMPLAINT: BP check  HPI:   Emily Newman is a 66 y.o.  with history notable for HTN, kyphosis, and OA of the R knee  presenting for follow up. The patient speaks Arabic (or [place]) as their primary language.  An interpreter was used for the entire visit.    HTN She is taking both amlodipine and amlodipine+olmesartan. Has some heart racing in the AM when she takes both. No lightheadedness or falls.   Right knee pain Patient has had right knee pain for years. X-rays reviewed again. Previously helped by voltaren gel but this is no longer helping. Feels her pain in anterior portion of knee and distal femur. No falls, weakness, numbness, or hip pain. She does report Tylenol helps but does not like to take this regularly.   PERTINENT  PMH / PSH/Family/Social History : updated and reviewed   OBJECTIVE:   BP 139/79   Pulse 100   Wt 141 lb (64 kg)   SpO2 98%   BMI 24.98 kg/m   Today's weight:  Last Weight  Most recent update: 03/13/2022  9:23 AM    Weight  64 kg (141 lb)            Review of prior weights: [employer]   03/13/22 0922  Weight: 141 lb (64 kg)     Cardiac: Regular rate and rhythm. Normal S1/S2. No murmurs, rubs, or gallops appreciated. Lungs: Clear bilaterally to ascultation.  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

## 2022-03-13 NOTE — Assessment & Plan Note (Signed)
At goal but not on optimal regimen DC amlodipine  Continue combination medication BMP today Follow up 6-8 weeks

## 2022-03-13 NOTE — Patient Instructions (Signed)
It was wonderful to see you today.  Please bring ALL of your medications with you to every visit.   Today we talked about:  -- A medication for your cough--you spray this in your nose once a day in the morning  - Checking your blood work  Take 2 pills of Tylenol every night before bed You will be called by Sports Medicine--I will let [name] know   - Dawa ya kikohozi chako--unanyunyizia pua yako mara moja kwa siku asubuhi  - Lenice Pressman yako ya damu  [place] vidonge 2 vya Tylenol kila usiku kabla ya kulala Utaitwa na Dawa ya Michezo--nitamjulisha [name]  Please follow up in 2 months   Thank you for choosing [facility].   Please call [phone] with any questions about today's appointment.  Please be sure to schedule follow up at the front  desk before you leave today.   [name], MD  Family Medicine

## 2022-03-14 ENCOUNTER — Encounter: Payer: Self-pay | Admitting: Family Medicine

## 2022-03-14 LAB — BASIC METABOLIC PANEL
BUN/Creatinine Ratio: 9 — ABNORMAL LOW (ref 12–28)
BUN: 8 mg/dL (ref 8–27)
CO2: 21 mmol/L (ref 20–29)
Calcium: 9 mg/dL (ref 8.7–10.3)
Chloride: 103 mmol/L (ref 96–106)
Creatinine, Ser: 0.91 mg/dL (ref 0.57–1.00)
Glucose: 100 mg/dL — ABNORMAL HIGH (ref 70–99)
Potassium: 4 mmol/L (ref 3.5–5.2)
Sodium: 140 mmol/L (ref 134–144)
eGFR: 69 mL/min/{1.73_m2} (ref >59)

## 2022-03-14 LAB — TSH RFX ON ABNORMAL TO FREE T4: TSH: 0.573 u[IU]/mL (ref 0.450–4.500)

## 2022-03-20 ENCOUNTER — Telehealth: Payer: Self-pay

## 2022-03-20 ENCOUNTER — Other Ambulatory Visit: Payer: Self-pay

## 2022-03-20 NOTE — Telephone Encounter (Addendum)
Received message from Dr. Owens Shark requesting to call patient and advise that patient be evaluated in urgent care for foot wound.   Called patient with Arabic interpreter [phone]. Patient did not answer on listed home number.   Attempted to reach [name], she also did not answer. LVM asking for returned phone call.   Talbot Grumbling, RN

## 2022-03-20 NOTE — Patient Instructions (Signed)
Visit Information  Ms. Haydon was given information about Medicaid Managed Care team care coordination services as a part of their [facility] Medicaid benefit. Christean Wolke verbally consented to engagement with the [facility] Managed Care team.   If you are experiencing a medical emergency, please call 911 or report to your local emergency department or urgent care.   If you have a non-emergency medical problem during routine business hours, please contact your provider's office and ask to speak with a nurse.   For questions related to your [facility] health plan, please call: [phone] or go here:[email or web address]  If you would like to schedule transportation through your [facility] plan, please call the following number at least 2 days in advance of your appointment: [phone].  You can also use the MTM portal or MTM mobile app to manage your rides. For the portal, please go to [email or web address].  Call the [place] at [phone], at any time, 24 hours a day, 7 days a week. If you are in danger or need immediate medical attention call 911.  If you would like help to quit smoking, call 1-800-QUIT-NOW 7124707807) OR Espaol: 1-855-Djelo-Ya (3-536-144-3154) o para ms informacin haga clic aqu or Text READY to 200-400 to register via text  Ms. Loadholt - following are the goals we discussed in your visit today:   Goals Addressed   None       Social Worker will follow up in 30-45 days .   Mickel Fuchs, BSW, [place] Managed Medicaid Team  [phone]   Following is a copy of your plan of care:  There are no care plans that you recently modified to display for this patient.

## 2022-03-20 NOTE — Patient Outreach (Signed)
Medicaid Managed Care Social Work Note  03/20/2022 Name:  Emily Newman MRN:  [id number] DOB:  27-Jun-1956  Emily Newman is an 66 y.o. year old female who is a primary patient of Emily Chandler, MD.  The Medicaid Managed Care Coordination team was consulted for assistance with:   housing   Ms. Emily was given information about Medicaid Managed Care Coordination team services today. Emily Newman  CSW agreed to services and verbal consent obtained.  Engaged with patient  for by telephone forfollow up visit in response to referral for case management and/or care coordination services.   Assessments/Interventions:  Review of past medical history, allergies, medications, health status, including review of consultants reports, laboratory and other test data, was performed as part of comprehensive evaluation and provision of chronic care management services.  SDOH: (Social Determinant of Health) assessments and interventions performed: BSW completed a telephone outreach with patients caseworker Emily Newman. She stated patient still has not heard anything from [facility], she also checked her mail and did not see where any letters have been sent about housing. She stated she also spoke with patients CPS worker Emily Newman with DSS and she is also going to give [facility] a call. Emily Newman asked if patient had any upcoming appointments, she stated patient has a wound on her left foot, it is swollen and she is unable to wear closed toes shoes. BSW informed Emily Newman she would send Dr. Manson Newman a message. No other resources are needed at this time.  Advanced Directives Status:  Not addressed in this encounter.  Care Plan                 No Known Allergies  Medications Reviewed Today     Reviewed by Emily Chandler, MD (Physician) on 03/13/22 at 1220  Med List Status: <None>   Medication Order Taking? Sig Documenting Provider Last Dose Status Informant    Discontinued 03/28/21 0935   amLODipine-olmesartan (AZOR)  5-20 MG tablet [id number] Yes Take 1 tablet by mouth daily. Emily Chandler, MD Taking Active   diclofenac Sodium (VOLTAREN) 1 % GEL [id number]  Apply four times daily to knee Emily Chandler, MD  Active   fluticasone [facility]) 50 MCG/ACT nasal spray [id number] Yes Place 2 sprays into both nostrils daily. Emily Chandler, MD  Active   levocetirizine Elita Boone ALLERGY 24HR) 5 MG tablet [id number] Yes Take 1 tablet (5 mg total) by mouth every evening. Emily Chandler, MD Taking Active   omeprazole (PRILOSEC) 40 MG capsule [id number] Yes Take 1 capsule (40 mg total) by mouth daily. Emily Chandler, MD Taking Active   triamcinolone ointment (KENALOG) 0.5 % [id number] No Apply 1 application topically daily.  Patient not taking: Reported on 01/02/2022   Emily Chandler, MD Not Taking Active   Zoster Vaccine Adjuvanted [facility]) injection [id number] No Inject 0.5 mLs into the muscle every 2 (two) months.  Patient not taking: Reported on 02/06/2022   Emily Chandler, MD Not Taking Active             Patient Active Problem List   Diagnosis Date Noted   Osteoarthritis of right knee 04/05/2021   Essential hypertension 07/17/2020   Vision impairment 07/17/2020   Thyroid nodule 07/17/2020   Refugee health examination 07/16/2020    Conditions to be addressed/monitored per PCP order:   housing   There are no care plans that you recently modified to display for this patient.   Follow up:  Patient agrees to Care  Plan and Follow-up.  Plan: The Managed Medicaid care management team will reach out to the patient again over the next 30-45 days.  Date/time of next scheduled Social Work care management/care coordination outreach:  04/22/22  Emily Newman, Emily Newman, Emily Newman Medicaid Team  [phone]

## 2022-04-15 ENCOUNTER — Ambulatory Visit: Payer: Medicaid Other

## 2022-04-15 DIAGNOSIS — Z23 Encounter for immunization: Secondary | ICD-10-CM

## 2022-04-21 NOTE — Congregational Nurse Program (Signed)
Patient came in for routine blood pressure monitoring. She reports to be compliant with medication.  Earlie Server Nikalas Bramel RN BSN [place] Nurse [phone]

## 2022-04-21 NOTE — Congregational Nurse Program (Deleted)
  Dept: [phone]   Congregational Nurse Program Note  Date of Encounter: 04/21/2022  Past Medical History: Past Medical History:  Diagnosis Date   GERD (gastroesophageal reflux disease)    Hypertension    Vision impairment     Encounter Details:  CNP Questionnaire - 04/21/22 1229       Questionnaire   Ask client: Do you give verbal consent for me to treat you today? Yes    Student Assistance N/A    Location Patient Served  [place]    Visit Setting with Client [place]    Patient Status Refugee    Insurance Medicaid    Insurance/Financial Assistance Referral N/A    Medication Have Medication Insecurities    Medical Provider Yes    Screening Referrals Made N/A    Medical Referrals Made N/A    Medical Appointment Made N/A    Recently w/o PCP, now 1st time PCP visit completed due to CNs referral or appointment made N/A    Food Have Food Insecurities    Transportation Need transportation assistance    Housing/Utilities N/A    Interpersonal Safety N/A    Interventions Case Management;Counsel;Navigate Healthcare System;Educate;Advocate/Support    Abnormal to Normal Screening Since Last CN Visit Blood Pressure    Screenings CN Performed Blood Pressure    Sent Client to Lab for: N/A    Did client attend any of the following based off CNs referral or appointments made? N/A    ED Visit Averted N/A    Life-Saving Intervention Made N/A               Dept: [phone]   Congregational Nurse Program Note  Date of Encounter: 04/21/2022  Past Medical History: Past Medical History:  Diagnosis Date   GERD (gastroesophageal reflux disease)    Hypertension    Vision impairment     Encounter Details:  CNP Questionnaire - 04/21/22 1229       Questionnaire   Ask client: Do you give verbal consent for me to treat you today? Yes    Student Assistance N/A    Location Patient Served  [facility]    Visit Setting with Client [place]    Patient Status Refugee    Insurance  Medicaid    Insurance/Financial Assistance Referral N/A    Medication Have Medication Insecurities    Medical Provider Yes    Screening Referrals Made N/A    Medical Referrals Made N/A    Medical Appointment Made N/A    Recently w/o PCP, now 1st time PCP visit completed due to CNs referral or appointment made N/A    Food Have Food Insecurities    Transportation Need transportation assistance    Housing/Utilities N/A    Interpersonal Safety N/A    Interventions Case Management;Counsel;Navigate Healthcare System;Educate;Advocate/Support    Abnormal to Normal Screening Since Last CN Visit Blood Pressure    Screenings CN Performed Blood Pressure    Sent Client to Lab for: N/A    Did client attend any of the following based off CNs referral or appointments made? N/A    ED Visit Averted N/A    Life-Saving Intervention Made N/A

## 2022-04-22 ENCOUNTER — Other Ambulatory Visit: Payer: Self-pay

## 2022-04-22 NOTE — Patient Instructions (Signed)
Visit Information  Ms. Emily Newman  - as a part of your Medicaid benefit, you are eligible for care management and care coordination services at no cost or copay. I was unable to reach you by phone today but would be happy to help you with your health related needs. Please feel free to call me @ [phone]).   A member of the Managed Medicaid care management team will reach out to you again over the next 7 days.   Mickel Fuchs, BSW, [place] Managed Medicaid Team  [phone]

## 2022-04-22 NOTE — Patient Outreach (Signed)
Care Coordination  04/22/2022  [name] August 12, 1955 [id number]   Medicaid Managed Care   Unsuccessful Outreach Note  04/22/2022 Name: Emily Newman MRN: [id number] DOB: Oct 17, 1955  Referred by: Martyn Malay, MD Reason for referral : High Risk Managed Medicaid (MM Social work Unsuccessful Telephone outreach )   An unsuccessful telephone outreach was attempted today. The patient was referred to the case management team for assistance with care management and care coordination.   Follow Up Plan: The care management team will reach out to the patient again over the next 30 days.   Mickel Fuchs, BSW, [place] Managed Medicaid Team  [phone]

## 2022-04-27 IMAGING — CR DG CHEST 2V
2 series · 2 of 2 positions shown · non-contrast
Comparison: 12/23/2020

CLINICAL DATA: 65-year-old female with a history of cough for 6
weeks

EXAM:
CHEST - 2 VIEW

[w chest pa]
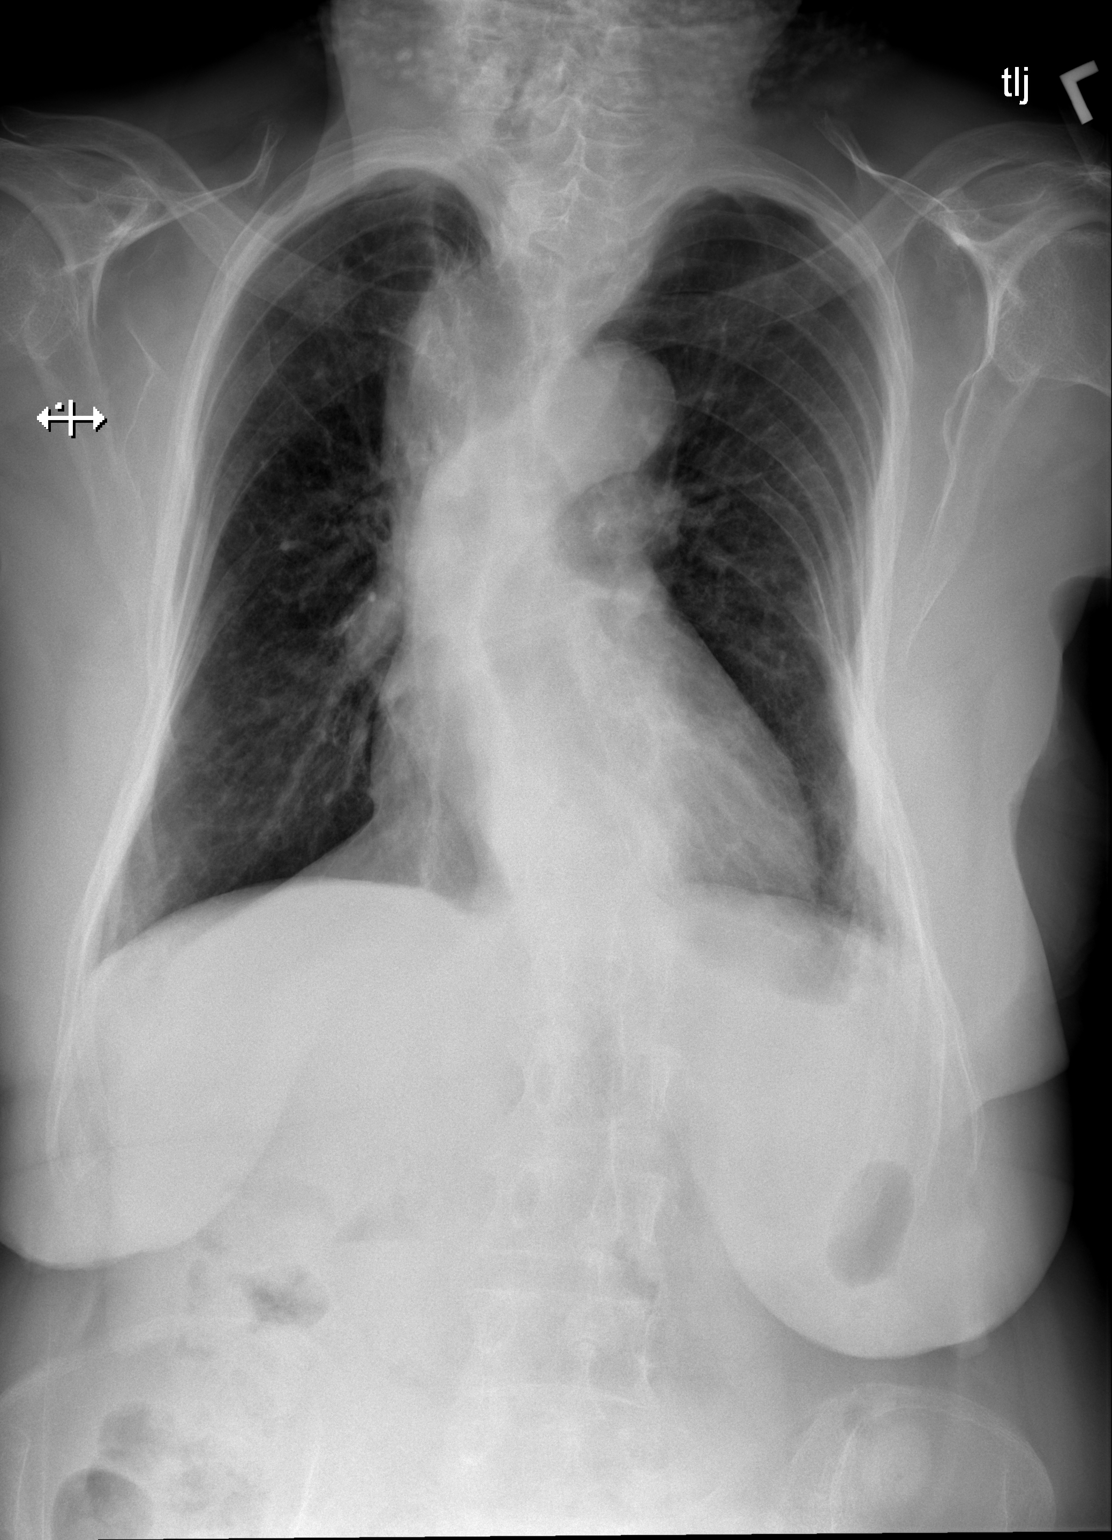

[w chest lat]
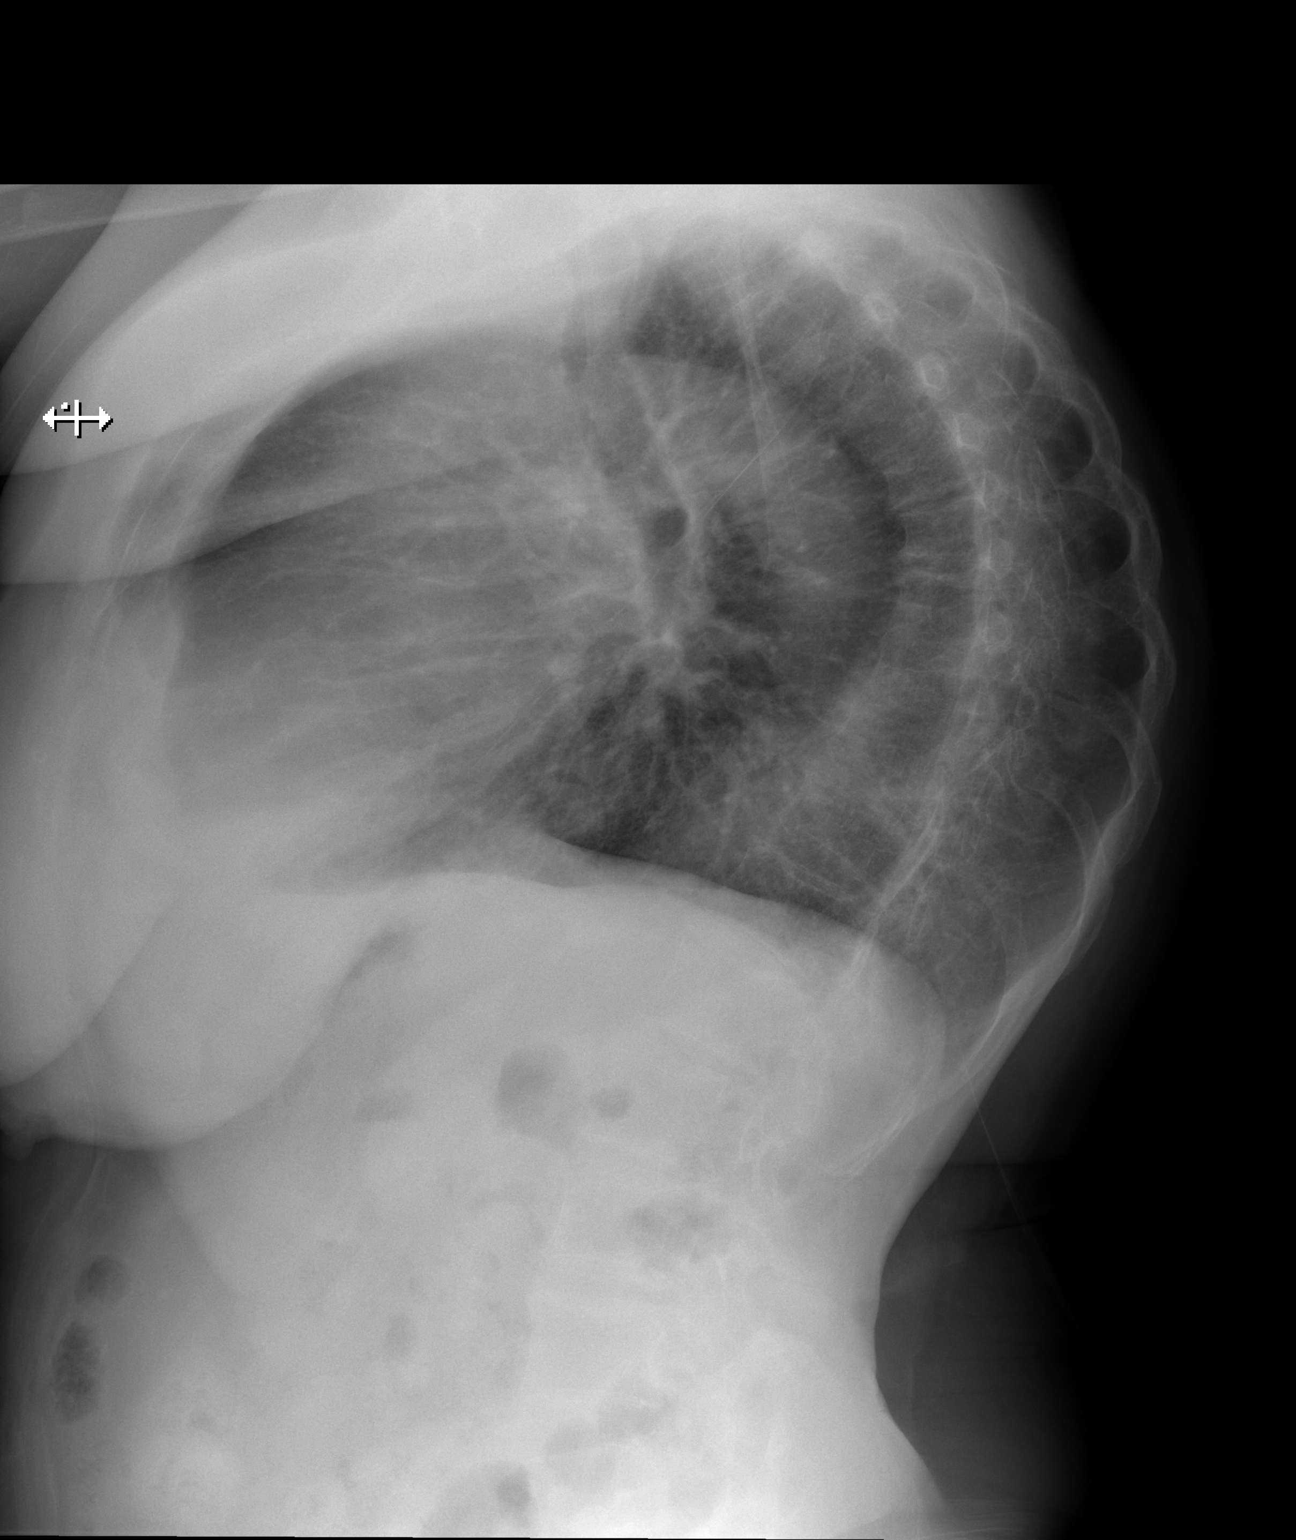

[2 of 2 positions shown; findings below may reference images not displayed]

FINDINGS: Cardiomediastinal silhouette appears improved with improved diameter
though there is less left rotation on the current.

No evidence of central vascular congestion. No interlobular septal
thickening.

Kyphotic curvature as well as scoliotic curvature on the frontal
view. Coarsened interstitial markings with no confluent airspace
disease. No pneumothorax or pleural effusion.

Degenerative changes.  No displaced fracture
IMPRESSION: No evidence of acute cardiopulmonary disease.

## 2022-05-25 ENCOUNTER — Ambulatory Visit: Payer: Medicaid Other | Admitting: Family Medicine

## 2022-06-24 ENCOUNTER — Other Ambulatory Visit: Payer: Self-pay

## 2022-06-24 ENCOUNTER — Encounter: Payer: Self-pay | Admitting: Family Medicine

## 2022-06-24 ENCOUNTER — Ambulatory Visit (INDEPENDENT_AMBULATORY_CARE_PROVIDER_SITE_OTHER): Payer: Medicaid Other | Admitting: Family Medicine

## 2022-06-24 VITALS — BP 163/77 | HR 75 | Wt 143.6 lb

## 2022-06-24 DIAGNOSIS — I10 Essential (primary) hypertension: Secondary | ICD-10-CM | POA: Diagnosis present

## 2022-06-24 DIAGNOSIS — M1711 Unilateral primary osteoarthritis, right knee: Secondary | ICD-10-CM | POA: Diagnosis not present

## 2022-06-24 MED ORDER — DICLOFENAC SODIUM 1 % EX GEL
CUTANEOUS | 1 refills | Status: DC
Start: 2022-06-24 — End: 2023-04-13

## 2022-06-24 NOTE — Assessment & Plan Note (Signed)
Not controlled in office but there was some aggravation getting the video interpreter.  Recheck not much better.  Emphasized to bring in her medications as I'm not sure what she is taking

## 2022-06-24 NOTE — Patient Instructions (Addendum)
Emily Tylenol 325 mg tab 2 tilu kali sapo pikeun nyeri dengkul   Rub gl voltaren dina dengkul anjeun opat kali sapo  Mangga sok bawa botol obat  Tingali Dr Manson Passey dina sabulan    Take Tylenol 325 mg tabs 2 three times a day for the knee pain     Rub the voltaren gel on your knee four times a day   Please always bring your medication bottles  See Dr Manson Passey in one Newman

## 2022-06-24 NOTE — Progress Notes (Signed)
    SUBJECTIVE:   CHIEF COMPLAINT / HPI:   Knee Pain Both knees with R worse than left.  Indicates pain is fairly focal on the lateral lower patella.  Not using any medications because ran out.  Does not know what she was using.  Not using any cream.  No pain elsewhere.  Hypertension Taking a medication (last took yesterday) but does not know name and did not bring in.  No edema or lightheadness   PMH - prior xrays of knees showed DJD  OBJECTIVE:   BP (!) 163/77   Pulse 75   Wt 143 lb 9.6 oz (65.1 kg)   SpO2 98%   BMI 25.44 kg/m   Heart - Regular rate and rhythm.  No murmurs, gallops or rubs.    Lungs:  Normal respiratory effort, chest expands symmetrically. Lungs are clear to auscultation, no crackles or wheezes. Knees - walks around room without limp or evident pain.  FROM of both knees but indicates has pain over lateral knee.  No effusion or laxity or lower edema No pain in ankles or hips with range of motion     ASSESSMENT/PLAN:   Essential hypertension Assessment & Plan: Not controlled in office but there was some aggravation getting the video interpreter.  Recheck not much better.  Emphasized to bring in her medications as I'm not sure what she is taking   Primary osteoarthritis of right knee Assessment & Plan: Bilateral knee pain without significant activity limitation.  Refilled voltaren and showed picture of acetaminophen.  Asked her to use these and follow up    Other orders -     Diclofenac Sodium; Apply four times daily to knee  Dispense: 350 g; Refill: 1     Patient Instructions  [place] Tylenol 325 mg tab 2 tilu kali sapo pikeun nyeri dengkul   Rub gl voltaren dina dengkul anjeun opat kali sapo  Mangga sok bawa botol obat  Tingali Dr Manson Passey dina sabulan    Take Tylenol 325 mg tabs 2 three times a day for the knee pain     Rub the voltaren gel on your knee four times a day   Please always bring your medication bottles  See Dr Manson Passey in  one month   Carney Living, MD [facility]

## 2022-06-24 NOTE — Assessment & Plan Note (Signed)
Bilateral knee pain without significant activity limitation.  Refilled voltaren and showed picture of acetaminophen.  Asked her to use these and follow up

## 2022-07-15 NOTE — Congregational Nurse Program (Signed)
  Dept: [phone]   Congregational Nurse Program Note  Date of Encounter: 07/15/2022  Past Medical History: Past Medical History:  Diagnosis Date   GERD (gastroesophageal reflux disease)    Hypertension    Vision impairment     Encounter Details:  CNP Questionnaire - 07/15/22 1200       Questionnaire   Ask client: Do you give verbal consent for me to treat you today? Yes    Student Assistance N/A    Location Patient Served  [facility]    Visit Setting with Client Organization    Patient Status Refugee    Insurance Medicaid    Insurance/Financial Assistance Referral N/A    Medication Have Medication Insecurities    Medical Provider Yes    Screening Referrals Made N/A    Medical Referrals Made N/A    Medical Appointment Made N/A    Recently w/o PCP, now 1st time PCP visit completed due to CNs referral or appointment made N/A    Food Have Food Insecurities    Transportation Need transportation assistance    Housing/Utilities N/A    Interpersonal Safety N/A    Interventions Case Management;Counsel;Navigate Healthcare System;Educate;Advocate/Support    Abnormal to Normal Screening Since Last CN Visit Blood Pressure    Screenings CN Performed Blood Pressure    Sent Client to Lab for: N/A    Did client attend any of the following based off CNs referral or appointments made? N/A    ED Visit Averted Yes    Life-Saving Intervention Made N/A            Patient came in for routine blood pressure check.Bp elevated today 163/91 repeat 160/84. Patient has not taken medication today. Usually takes in the mornings. Educated on compliance. Educated on diet to use little or no salt. I have asked her to bring the pills bottles next week for medication check and  education.  Earlie Server Edward Trevino RN BSN [place] Nurse [phone]

## 2022-07-24 ENCOUNTER — Ambulatory Visit: Payer: Medicaid Other | Admitting: Student

## 2022-07-27 NOTE — Progress Notes (Unsigned)
    SUBJECTIVE:   CHIEF COMPLAINT / HPI:   Essential hypertension Brings in her medication bottles.  Her amlodipine combo bottle is empty "I finished it about a month ago"    Primary osteoarthritis of right knee She feels this is better with the tylenol and voltaren gel  Audio interpreter used  OBJECTIVE:   BP (!) 178/85   Pulse 78   Ht 5\' 2"  (1.575 m)   Wt 153 lb 9.6 oz (69.7 kg)   SpO2 100%   BMI 28.09 kg/m   Alert happy appearing   ASSESSMENT/PLAN:   Primary osteoarthritis of right knee Assessment & Plan: Improved.  Continue current therapy    Essential hypertension Assessment & Plan: Poorly controlled likely due to being out of her medicine.  We discussed importance of taking one pill every day for the next 20 years.   She plans to pick up from the pharmacy today.  Asked to come back in one month with all her medications   Orders: -     amLODIPine-Olmesartan; Take 1 tablet by mouth daily.  Dispense: 90 tablet; Refill: 3     Patient Instructions  Good to see you today - Thank you for coming in  Things we discussed today:  Take the Amlodipine Olmesartan every day for the next 20 years   Please always bring your medication bottles  Come back to see me in 1 month   Wilujeng sumping dinten ieu - Hatur nuhun parantos sumping  Hal-hal anu urang bahas dinten ieu:  [place]ak Amlodipine Olmesartan unggal dinten salami 20 taun ka hareup   Mangga sok bawa botol obat  Datang deui ka kuring dina 1 sasih   Lind Covert, MD [place]

## 2022-07-28 ENCOUNTER — Other Ambulatory Visit: Payer: Self-pay

## 2022-07-28 ENCOUNTER — Encounter: Payer: Self-pay | Admitting: Family Medicine

## 2022-07-28 ENCOUNTER — Ambulatory Visit (INDEPENDENT_AMBULATORY_CARE_PROVIDER_SITE_OTHER): Payer: Medicaid Other | Admitting: Family Medicine

## 2022-07-28 VITALS — BP 178/85 | HR 78 | Ht 62.0 in | Wt 153.6 lb

## 2022-07-28 DIAGNOSIS — I10 Essential (primary) hypertension: Secondary | ICD-10-CM

## 2022-07-28 DIAGNOSIS — M1711 Unilateral primary osteoarthritis, right knee: Secondary | ICD-10-CM

## 2022-07-28 MED ORDER — AMLODIPINE-OLMESARTAN 5-20 MG PO TABS: 1.0000 | ORAL_TABLET | Freq: Every day | ORAL | 3 refills | Status: DC

## 2022-07-28 NOTE — Patient Instructions (Addendum)
Good to see you today - Thank you for coming in  Things we discussed today:  Take the Amlodipine Olmesartan every day for the next 20 years   Please always bring your medication bottles  Come back to see me in 1 month   Wilujeng sumping dinten ieu - Hatur nuhun parantos sumping  Hal-hal anu urang bahas dinten ieu:  Candak Amlodipine Olmesartan unggal dinten salami 20 taun ka hareup   Mangga sok bawa botol obat  Datang deui ka kuring dina 1 sasih

## 2022-07-28 NOTE — Assessment & Plan Note (Signed)
Improved. Continue current therapy.

## 2022-07-28 NOTE — Assessment & Plan Note (Signed)
Poorly controlled likely due to being out of her medicine.  We discussed importance of taking one pill every day for the next 20 years.   She plans to pick up from the pharmacy today.  Asked to come back in one month with all her medications

## 2022-08-17 IMAGING — US US THYROID
1 series · 13 of 25 positions shown · non-contrast
Comparison: August 2020

CLINICAL DATA: Prior ultrasound follow-up.

EXAM:
THYROID ULTRASOUND
TECHNIQUE: Ultrasound examination of the thyroid gland and adjacent soft
tissues was performed.

[Series 1: us thyroid · 13 of 55 slices shown]
[im 1/55]
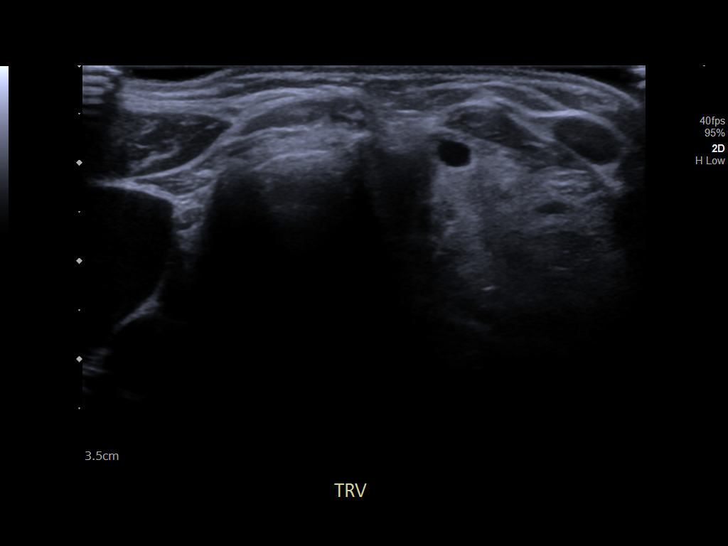
[im 5/55]
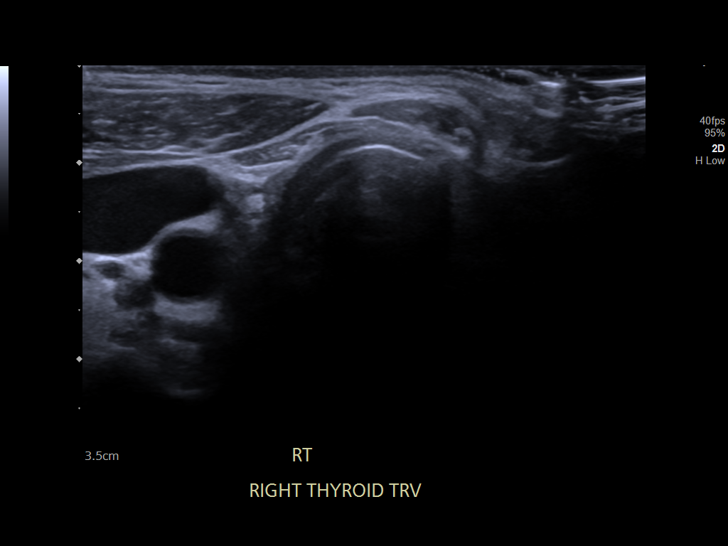
[im 10/55]
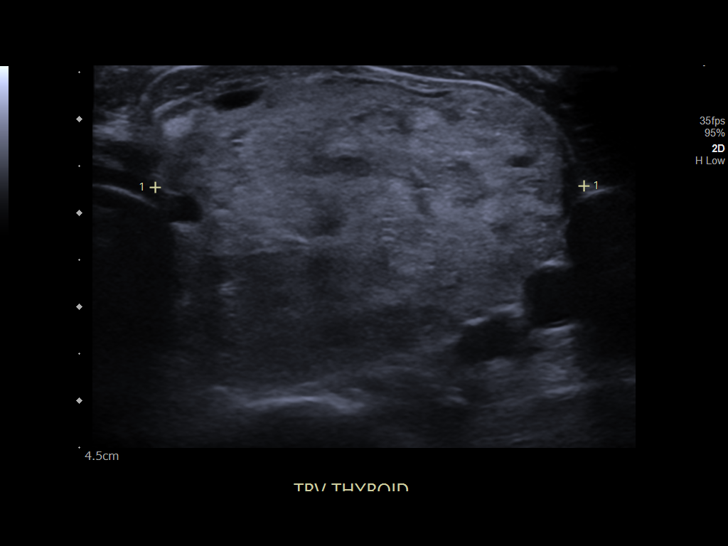
[im 14/55]
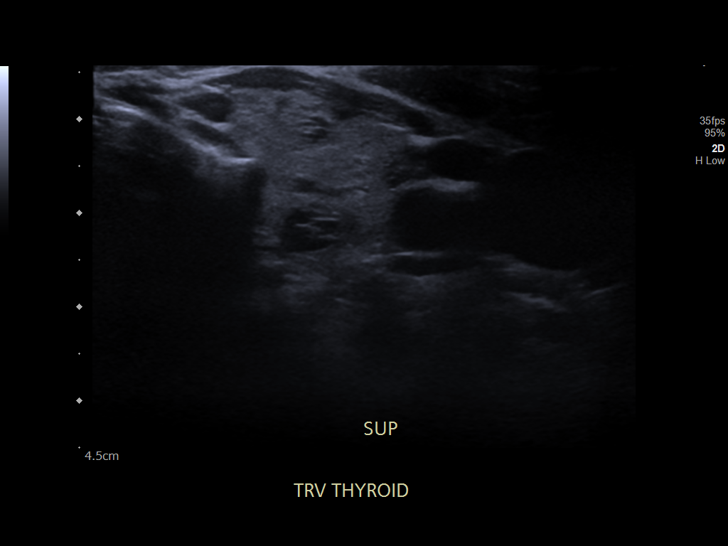
[im 19/55]
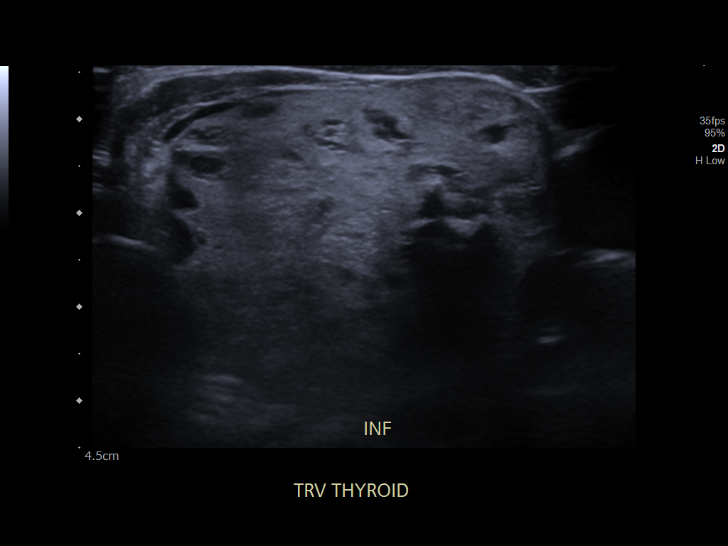
[im 23/55]
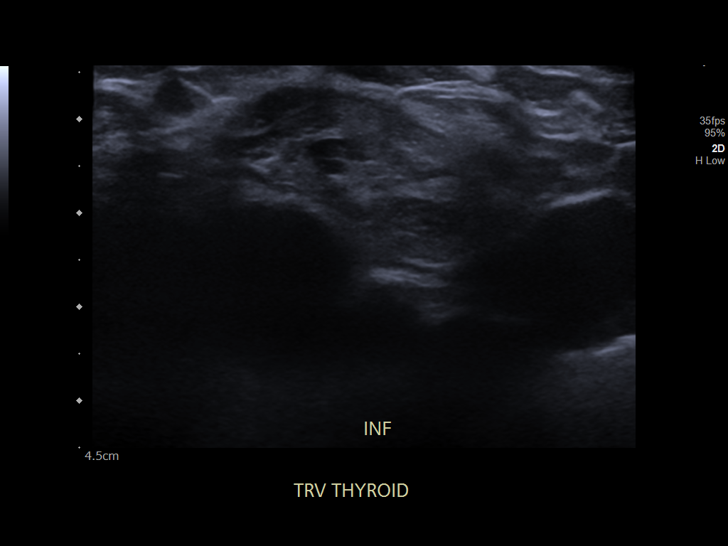
[im 28/55]
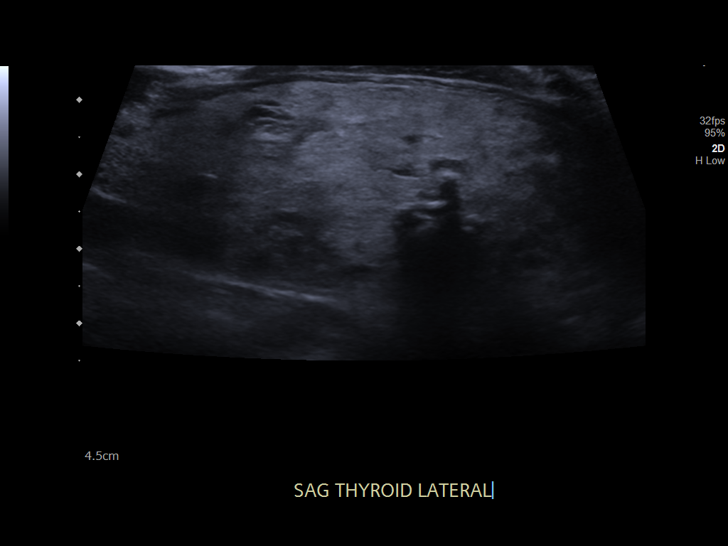
[im 32/55]
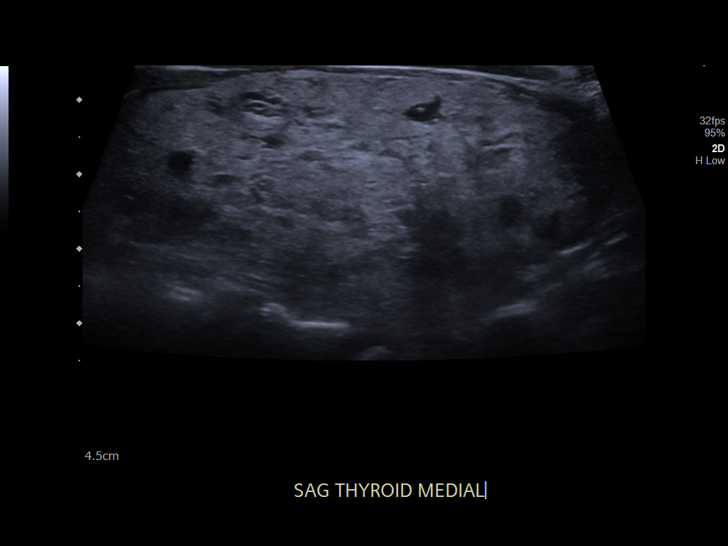
[im 37/55]
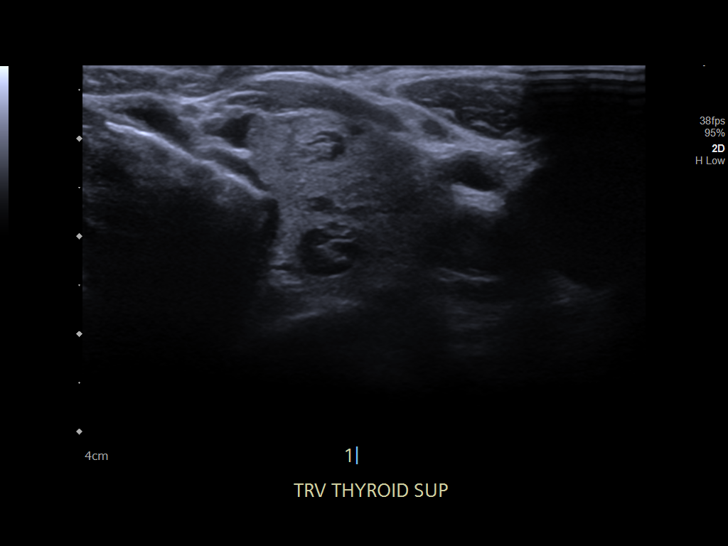
[im 41/55]
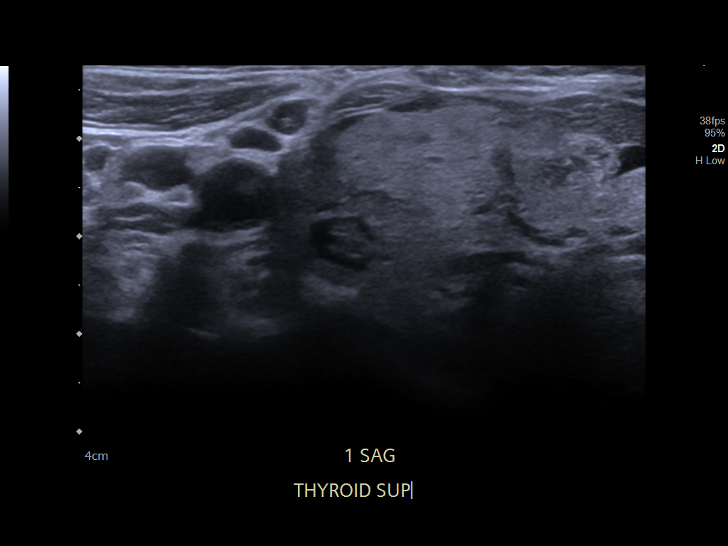
[im 46/55]
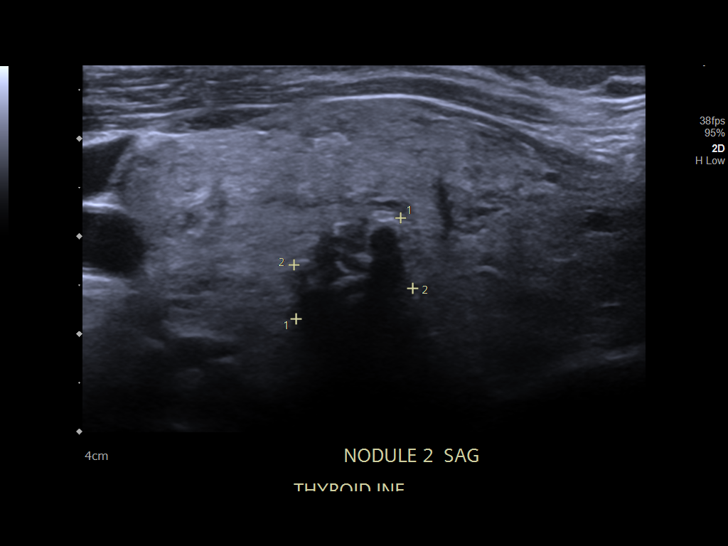
[im 50/55]
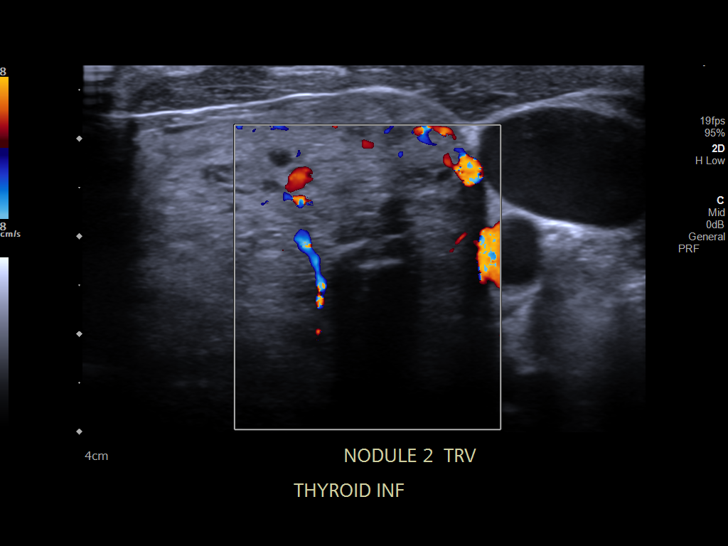
[im 55/55]
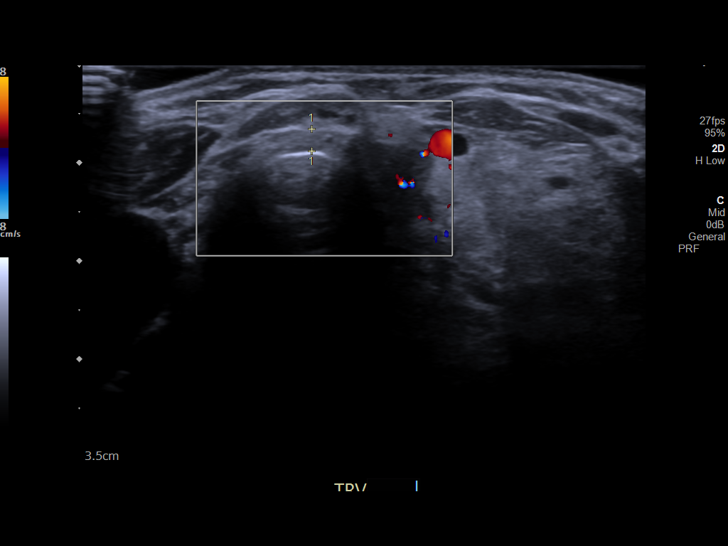

[13 of 25 positions shown; findings below may reference images not displayed]

FINDINGS: Parenchymal Echotexture: Moderately heterogenous

Isthmus: 0.2 cm

Right lobe: Surgically absent

Left lobe: 7.3 x 3.7 x 4.6 cm

_________________________________________________________

Estimated total number of nodules >/= 1 cm: 1

Number of spongiform nodules >/=  2 cm not described below (TR1): 0

Number of mixed cystic and solid nodules >/= 1.5 cm not described
below (TR2): 0

_________________________________________________________

Status post right hemithyroidectomy. No abnormal soft tissue
identified in the hemithyroidectomy bed.

Nodule labeled 1 is a small subcentimeter mixed cystic and solid TR
2 nodule in the superior left thyroid lobe. This nodule does NOT
meet TI-RADS criteria for biopsy or dedicated follow-up.

Nodule labeled 2 is a solid-appearing nodule with macro
calcification (TR 4) that measures up to 1.1 cm. It remains similar
in size and morphology. *Given size (>/= 1 - 1.4 cm) and appearance,
a follow-up ultrasound in 1 year should be considered based on
TI-RADS criteria.
IMPRESSION: 1. Stable postsurgical changes of right hemithyroidectomy. No new or
enlarging thyroid nodules.
2. Nodule labeled 2 (1.1 cm TR 4) remains similar in size and
morphology, and meets criteria for follow-up ultrasound in 1 year.
This exam marks 1 year stability. A total follow-up interval of 5
years is recommended.

The above is in keeping with the ACR TI-RADS recommendations - [HOSPITAL] 1833;[DATE].

## 2022-08-25 ENCOUNTER — Ambulatory Visit: Payer: Medicaid Other | Admitting: Family Medicine

## 2022-08-25 NOTE — Progress Notes (Deleted)
    SUBJECTIVE:   CHIEF COMPLAINT / HPI:   Primary osteoarthritis of right knee   Essential hypertension  PERTINENT  PMH / PSH: hypertension, DJD, Thyroid nodule   OBJECTIVE:   There were no vitals taken for this visit.  ***  ASSESSMENT/PLAN:   There are no diagnoses linked to this encounter.   There are no Patient Instructions on file for this visit.   Lind Covert, MD [place]

## 2022-10-09 NOTE — Progress Notes (Unsigned)
    SUBJECTIVE:   CHIEF COMPLAINT: check up HPI:   Emily Newman is a 67 y.o.  with history notable for HTN presenting for follow up.   PERTINENT  PMH / PSH/Family/Social History : ***  OBJECTIVE:   There were no vitals taken for this visit.  Today's weight:  Review of prior weights: There were no vitals filed for this visit.  ***  ASSESSMENT/PLAN:   No problem-specific Assessment & Plan notes found for this encounter.     {    This will disappear when note is signed, click to select method of visit    :1}  [name], MD  Family Medicine Teaching Service  [facility]

## 2022-10-12 ENCOUNTER — Encounter: Payer: Self-pay | Admitting: Family Medicine

## 2022-10-12 ENCOUNTER — Ambulatory Visit (INDEPENDENT_AMBULATORY_CARE_PROVIDER_SITE_OTHER): Payer: Medicaid Other | Admitting: Family Medicine

## 2022-10-12 ENCOUNTER — Other Ambulatory Visit: Payer: Self-pay

## 2022-10-12 VITALS — BP 150/80 | HR 81 | Ht 62.0 in | Wt 146.0 lb

## 2022-10-12 DIAGNOSIS — M1711 Unilateral primary osteoarthritis, right knee: Secondary | ICD-10-CM | POA: Diagnosis not present

## 2022-10-12 DIAGNOSIS — I10 Essential (primary) hypertension: Secondary | ICD-10-CM

## 2022-10-12 DIAGNOSIS — Z1231 Encounter for screening mammogram for malignant neoplasm of breast: Secondary | ICD-10-CM | POA: Diagnosis not present

## 2022-10-12 DIAGNOSIS — Z780 Asymptomatic menopausal state: Secondary | ICD-10-CM

## 2022-10-12 DIAGNOSIS — K219 Gastro-esophageal reflux disease without esophagitis: Secondary | ICD-10-CM | POA: Diagnosis not present

## 2022-10-12 MED ORDER — AMLODIPINE-OLMESARTAN 5-20 MG PO TABS: 1.0000 | ORAL_TABLET | Freq: Every day | ORAL | 3 refills | Status: DC

## 2022-10-12 MED ORDER — OMEPRAZOLE 40 MG PO CPDR: 40.0000 mg | DELAYED_RELEASE_CAPSULE | Freq: Every day | ORAL | 3 refills | Status: DC

## 2022-10-12 NOTE — Assessment & Plan Note (Addendum)
Pain in R knee only occurs with movement and has not worsened since last visit. Pain iis well controlled with Voltaren gel. Continue current regimen. F/u as needed.

## 2022-10-12 NOTE — Progress Notes (Signed)
    SUBJECTIVE:   CHIEF COMPLAINT: check up HPI:   The patient speaks [place] (and [place] Arabic)  as their primary language.  An interpreter was used for the entire visit.    Emily Newman is a 67 y.o.  with history notable for HTN presenting for follow up. She has no concerns today other than needing refills on her Azor and omeprazole. Denies chest pain, sob, headaches, N/V, vision changes, urinary, bowel, or dietary changes.  R knee pain -Patient has osteoarthritis of R knee. She only has pain with movement and it has not increased in intensity since last visit. Volaren gel controls pain well.   PERTINENT  PMH / PSH/Family/Social History :  HTN Osteoarthritis of R knee  OBJECTIVE:   BP (!) 150/80   Pulse 81   Ht 5\' 2"  (1.575 m)   Wt 146 lb (66.2 kg)   SpO2 99%   BMI 26.70 kg/m   Today's weight:  Last Weight  Most recent update: 10/12/2022  9:30 AM    Weight  66.2 kg (146 lb)            Review of prior weights: [employer]   10/12/22 0929  Weight: 146 lb (66.2 kg)    Gen: pleasant, in no acute distress CV: RRR, no MRG Pulm: clear to auscultation bilaterally Ext: no edema in lower ext bilaterally  ASSESSMENT/PLAN:   Essential hypertension Patient ran out of BP medication, refill sent. BP improved to 150/80 on repeat measurement. Continue taking Azor 5-20mg  daily. F/u in 1 month.  Osteoarthritis of right knee Pain in R knee only occurs with movement and has not worsened since last visit. Pain iis well controlled with Voltaren gel. Continue current regimen. F/u as needed.     Hypertension- BMP today.   Scheduled follow up to discuss mammogram and DEXA   Patient seen along with MS[employer] student Takiesha Mcdevitt. I personally evaluated this patient along with the student, and verified all aspects of the history, physical exam, and medical decision making as documented by the student. I agree with the student's documentation and have made all necessary edits.   [name], MD  Family Medicine Teaching Service  [facility]

## 2022-10-12 NOTE — Patient Instructions (Signed)
It was wonderful to see you today.  Please bring ALL of your medications with you to every visit.   Today we talked about:   - Restarting your blood pressure pill - I sent in your stomach pill  We will check blood work today    Please follow up in 2 months   Thank you for choosing [facility].   Please call [phone] with any questions about today's appointment.  Please be sure to schedule follow up at the front  desk before you leave today.   [name], MD  Family Medicine

## 2022-10-12 NOTE — Assessment & Plan Note (Addendum)
Patient ran out of BP medication, refill sent. BP improved to 150/80 on repeat measurement. Continue taking Azor 5-20mg  daily. F/u in 1 month.

## 2022-10-13 ENCOUNTER — Encounter: Payer: Self-pay | Admitting: Family Medicine

## 2022-10-13 LAB — BASIC METABOLIC PANEL
BUN/Creatinine Ratio: 15 (ref 12–28)
BUN: 12 mg/dL (ref 8–27)
CO2: 19 mmol/L — ABNORMAL LOW (ref 20–29)
Calcium: 8.8 mg/dL (ref 8.7–10.3)
Chloride: 106 mmol/L (ref 96–106)
Creatinine, Ser: 0.8 mg/dL (ref 0.57–1.00)
Glucose: 100 mg/dL — ABNORMAL HIGH (ref 70–99)
Potassium: 3.8 mmol/L (ref 3.5–5.2)
Sodium: 141 mmol/L (ref 134–144)
eGFR: 81 mL/min/{1.73_m2} (ref >59)

## 2022-11-19 NOTE — Progress Notes (Deleted)
    SUBJECTIVE:   CHIEF COMPLAINT: check up HPI:  The patient speaks [place] and Arabic as their primary language.  An interpreter was used for the entire visit.    Emily Newman is a 67 y.o.  with history notable for HTN and OA presenting for Follow up .   PERTINENT  PMH / PSH/Family/Social History : knee OA, HTN  OBJECTIVE:   There were no vitals taken for this visit.  Today's weight:  Review of prior weights: There were no vitals filed for this visit.   Cardiac: Regular rate and rhythm. Normal S1/S2. No murmurs, rubs, or gallops appreciated. Lungs: Clear bilaterally to ascultation.  Abdomen: Normoactive bowel sounds. No tenderness to deep or light palpation. No rebound or guarding.    Psych: Pleasant and appropriate    ASSESSMENT/PLAN:   Essential hypertension    HCM Discussed and ordered mammogram and DEXA   [name], MD  Family Medicine Teaching Service  [facility]

## 2022-11-20 ENCOUNTER — Ambulatory Visit: Payer: Medicaid Other | Admitting: Family Medicine

## 2022-11-20 DIAGNOSIS — Z780 Asymptomatic menopausal state: Secondary | ICD-10-CM

## 2022-11-20 DIAGNOSIS — Z1231 Encounter for screening mammogram for malignant neoplasm of breast: Secondary | ICD-10-CM

## 2022-11-20 DIAGNOSIS — I10 Essential (primary) hypertension: Secondary | ICD-10-CM

## 2022-11-26 ENCOUNTER — Telehealth: Payer: Self-pay

## 2022-11-26 NOTE — Telephone Encounter (Signed)
Attempted to call patient for follow up regarding reported social stressors.Call not picked up. I called her Niece [name] who informed me that her phone is not working and confirmed that patient`s 2 grandchildren were removed from her home by CPS. I will follow up on Tuesday if she comes to school for ESL and offer support as needed.  Nicole Cella Xochil Shanker RN BSN PCCN  [facility] [phone]-cell [phone]-office

## 2022-12-01 NOTE — Progress Notes (Signed)
This encounter was created in error - please disregard.

## 2022-12-01 NOTE — Congregational Nurse Program (Addendum)
  Dept: [phone]   Congregational Nurse Program Note  Date of Encounter: 12/01/2022  Past Medical History: Past Medical History:  Diagnosis Date   GERD (gastroesophageal reflux disease)    Hypertension    Vision impairment     Encounter Details:  CNP Questionnaire - 12/01/22 1213       Questionnaire   Ask client: Do you give verbal consent for me to treat you today? Yes    Student Assistance [place] Nurse    Location Patient Served  [facility]    Visit Setting with Client Organization    Patient Status Refugee    Insurance Medicaid    Insurance/Financial Assistance Referral N/A    Medication Have Medication Insecurities    Medical Provider Yes    Screening Referrals Made N/A    Medical Referrals Made N/A    Medical Appointment Made N/A    Recently w/o PCP, now 1st time PCP visit completed due to CNs referral or appointment made N/A    Food Have Food Insecurities    Transportation Need transportation assistance    Housing/Utilities N/A    Interpersonal Safety N/A    Interventions Case Management;Counsel;Navigate Healthcare System;Educate;Advocate/Support    Abnormal to Normal Screening Since Last CN Visit Blood Pressure    Screenings CN Performed Blood Pressure    Sent Client to Lab for: N/A    Did client attend any of the following based off CNs referral or appointments made? N/A    ED Visit Averted Yes    Life-Saving Intervention Made N/A             Pt came in today for a blood pressure check. BP 136/75 HR 68. Used a Nurse, learning disability for the visit today. She bumped her leg 2 days ago and is taking some OTC pain medication.  She reports that her grandchildren were removed from her home and she is working with the case manager here at [facility] to navigate the system. I have informed her of upcoming appointment with PCP  Arman Bogus RN BSN PCCN  [facility] [phone]-cell [phone]-office

## 2022-12-02 NOTE — Progress Notes (Signed)
    SUBJECTIVE:   CHIEF COMPLAINT / HPI:   Discuss mammogram and DEXA- she notes she has too many appointments and does not want to do these at this time. Discussed risk of breast cancer and osteoporosis, and she prefers to wait.  Thyoid nodule- last 10/2021 recommended 1 year follow up. Denies trouble swallowing or neck pain. Does not want to repeat thyroid US at this time.  [place] interpreter used for entirety of visit.  HTN- tolerating medications well, no side effects.  PERTINENT  PMH / PSH: HTN, osteoarthritis of R knee  OBJECTIVE:   BP 128/76   Pulse 73   Ht 5\' 2"  (1.575 m)   Wt 140 lb (63.5 kg)   SpO2 100%   BMI 25.61 kg/m   General: A&O, NAD HEENT: No sign of trauma, EOM grossly intact, L sided thyroid nodule, scarring on right side from previous surgery appreciated Cardiac: RRR, no m/r/g Respiratory: CTAB, normal WOB, no w/c/r GI: Soft, NTTP, non-distended  Extremities: NTTP, no peripheral edema. Neuro: Normal gait, moves all four extremities appropriately. Psych: Appropriate mood and affect   ASSESSMENT/PLAN:   Thyroid nodule Declined [place], will continue to offer as 1 year f/u recommened, TSH within normal limits within last year  Essential hypertension At goal, BMP up to date, continue home medications   Declined DEXA scan and mammogram.  Billey Co, MD [facility]

## 2022-12-08 ENCOUNTER — Ambulatory Visit (INDEPENDENT_AMBULATORY_CARE_PROVIDER_SITE_OTHER): Payer: Medicaid Other | Admitting: Family Medicine

## 2022-12-08 VITALS — BP 128/76 | HR 73 | Ht 62.0 in | Wt 140.0 lb

## 2022-12-08 DIAGNOSIS — I10 Essential (primary) hypertension: Secondary | ICD-10-CM

## 2022-12-08 DIAGNOSIS — E041 Nontoxic single thyroid nodule: Secondary | ICD-10-CM

## 2022-12-08 NOTE — Patient Instructions (Signed)
It was wonderful to see you today.  Please bring ALL of your medications with you to every visit.   Today we talked about:  If you change your mind about your thyroid ultrasound, mammogram, or DXA scan please let us know.  We have scheduled you follow up in three months with Dr Manson Passey.  Thank you for choosing [facility].   Please call [phone] with any questions about today's appointment.  Please arrive at least 15 minutes prior to your scheduled appointments.   If you had blood work today, I will send you a MyChart message or a letter if results are normal. Otherwise, I will give you a call.   If you had a referral placed, they will call you to set up an appointment. Please give [place] a call if you don't hear back in the next 2 weeks.   If you need additional refills before your next appointment, please call your pharmacy first.   Burley Saver, MD  Family Medicine

## 2022-12-08 NOTE — Congregational Nurse Program (Signed)
  Dept: [phone]   Congregational Nurse Program Note  Date of Encounter: 12/08/2022  Past Medical History: Past Medical History:  Diagnosis Date   GERD (gastroesophageal reflux disease)    Hypertension    Vision impairment     Encounter Details:  CNP Questionnaire - 12/08/22 1127       Questionnaire   Ask client: Do you give verbal consent for me to treat you today? Yes    Actuary Nurse    Location Patient Served  [facility]    Visit Setting with Client Organization    Patient Status Refugee    Insurance Medicaid    Insurance/Financial Assistance Referral N/A    Medication Have Medication Insecurities    Medical Provider Yes    Screening Referrals Made N/A    Medical Referrals Made N/A    Medical Appointment Made N/A    Recently w/o PCP, now 1st time PCP visit completed due to CNs referral or appointment made N/A    Food Have Food Insecurities    Transportation N/A    Housing/Utilities N/A    Interpersonal Safety N/A    Interventions Case Management;Counsel;Navigate Healthcare System;Educate;Advocate/Support    Abnormal to Normal Screening Since Last CN Visit N/A    Screenings CN Performed Blood Pressure    Sent Client to Lab for: N/A    Did client attend any of the following based off CNs referral or appointments made? N/A    ED Visit Averted Yes    Life-Saving Intervention Made N/A            Pt came in for a blood pressure check and it was abnormal at 150/82. She came in to confirm her appointment time today and we confirmed it was at 1:45. All questions answered. No transportation needs for now.  Nicole Cella Damauri Minion RN BSN PCCN  [facility] [phone]-cell [phone]-office

## 2022-12-08 NOTE — Assessment & Plan Note (Signed)
At goal, BMP up to date, continue home medications

## 2022-12-08 NOTE — Assessment & Plan Note (Signed)
Declined [place], will continue to offer as 1 year f/u recommened, TSH within normal limits within last year

## 2022-12-16 NOTE — Congregational Nurse Program (Signed)
  Dept: [phone]   Congregational Nurse Program Note  Date of Encounter: 12/16/2022  Past Medical History: Past Medical History:  Diagnosis Date   GERD (gastroesophageal reflux disease)    Hypertension    Vision impairment     Encounter Details:  CNP Questionnaire - 12/16/22 1119       Questionnaire   Ask client: Do you give verbal consent for me to treat you today? Yes    Actuary Nurse    Location Patient Served  [facility]    Visit Setting with Client Organization    Patient Status Refugee    Insurance Medicaid    Insurance/Financial Assistance Referral N/A    Medication Have Medication Insecurities    Medical Provider Yes    Screening Referrals Made N/A    Medical Referrals Made N/A    Medical Appointment Made N/A    Recently w/o PCP, now 1st time PCP visit completed due to CNs referral or appointment made N/A    Food Have Food Insecurities    Transportation N/A    Housing/Utilities N/A    Interpersonal Safety N/A    Interventions Case Management;Counsel;Navigate Healthcare System;Educate;Advocate/Support    Abnormal to Normal Screening Since Last CN Visit N/A    Screenings CN Performed Blood Pressure    Sent Client to Lab for: N/A    Did client attend any of the following based off CNs referral or appointments made? N/A    ED Visit Averted Yes    Life-Saving Intervention Made N/A             Pt visited clinic today for routine blood pressure check. RN will continue to monitor blood pressure weekly. She reports to be compliant with her medications.  Nicole Cella Jaydah Stahle RN BSN PCCN  [facility] [phone]-cell [phone]-office

## 2023-02-25 NOTE — Progress Notes (Signed)
    SUBJECTIVE:   CHIEF COMPLAINT: BP check up HPI:   Emily Newman is a 67 y.o.  with history notable for HTN, OA and goiter presenting for follow up. The patient speaks Arabic [place])  as their primary language.  An interpreter was used for the entire visit.  Marland Kitchen   PERTINENT  PMH / PSH/Family/Social History : HTN OA of knees GERD   OBJECTIVE:   There were no vitals taken for this visit.  Today's weight:  Review of prior weights: There were no vitals filed for this visit.   Cardiac: Regular rate and rhythm. Normal S1/S2. No murmurs, rubs, or gallops appreciated. Lungs: Clear bilaterally to ascultation.  Abdomen: Normoactive bowel sounds. No tenderness to deep or light palpation. No rebound or guarding.  ***  Psych: Pleasant and appropriate   ASSESSMENT/PLAN:   No problem-specific Assessment & Plan notes found for this encounter.     {    This will disappear when note is signed, click to select method of visit    :1}  [name], MD  Family Medicine Teaching Service  [facility]

## 2023-02-26 ENCOUNTER — Encounter (INDEPENDENT_AMBULATORY_CARE_PROVIDER_SITE_OTHER): Payer: Medicaid Other | Admitting: Family Medicine

## 2023-02-26 DIAGNOSIS — I10 Essential (primary) hypertension: Secondary | ICD-10-CM

## 2023-02-26 DIAGNOSIS — R739 Hyperglycemia, unspecified: Secondary | ICD-10-CM

## 2023-02-26 DIAGNOSIS — E041 Nontoxic single thyroid nodule: Secondary | ICD-10-CM

## 2023-03-09 ENCOUNTER — Emergency Department (HOSPITAL_COMMUNITY): Payer: Medicaid Other

## 2023-03-09 ENCOUNTER — Other Ambulatory Visit: Payer: Self-pay

## 2023-03-09 ENCOUNTER — Emergency Department (HOSPITAL_COMMUNITY)
Admission: EM | Admit: 2023-03-09 | Discharge: 2023-03-10 | Disposition: A | Discharge status: Home / Self Care | Payer: Medicaid Other | Attending: Emergency Medicine | Admitting: Emergency Medicine

## 2023-03-09 DIAGNOSIS — Z20822 Contact with and (suspected) exposure to covid-19: Secondary | ICD-10-CM | POA: Diagnosis not present

## 2023-03-09 DIAGNOSIS — B349 Viral infection, unspecified: Secondary | ICD-10-CM | POA: Insufficient documentation

## 2023-03-09 DIAGNOSIS — R5383 Other fatigue: Secondary | ICD-10-CM

## 2023-03-09 DIAGNOSIS — R531 Weakness: Secondary | ICD-10-CM | POA: Diagnosis not present

## 2023-03-09 LAB — COMPREHENSIVE METABOLIC PANEL
ALT: 10 U/L (ref 0–44)
AST: 22 U/L (ref 15–41)
Albumin: 3.3 g/dL — ABNORMAL LOW (ref 3.5–5.0)
Alkaline Phosphatase: 95 U/L (ref 38–126)
Anion gap: 6 (ref 5–15)
BUN: 5 mg/dL — ABNORMAL LOW (ref 8–23)
CO2: 23 mmol/L (ref 22–32)
Calcium: 8 mg/dL — ABNORMAL LOW (ref 8.9–10.3)
Chloride: 106 mmol/L (ref 98–111)
Creatinine, Ser: 0.71 mg/dL (ref 0.44–1.00)
GFR, Estimated: >60 mL/min (ref >60)
Glucose, Bld: 105 mg/dL — ABNORMAL HIGH (ref 70–99)
Potassium: 3.4 mmol/L — ABNORMAL LOW (ref 3.5–5.1)
Sodium: 135 mmol/L (ref 135–145)
Total Bilirubin: 0.4 mg/dL (ref 0.3–1.2)
Total Protein: 8.2 g/dL — ABNORMAL HIGH (ref 6.5–8.1)

## 2023-03-09 LAB — CBC WITH DIFFERENTIAL/PLATELET
Abs Immature Granulocytes: 0.01 10*3/uL (ref 0.00–0.07)
Basophils Absolute: 0 10*3/uL (ref 0.0–0.1)
Basophils Relative: 1 %
Eosinophils Absolute: 0 10*3/uL (ref 0.0–0.5)
Eosinophils Relative: 1 %
HCT: 32.6 % — ABNORMAL LOW (ref 36.0–46.0)
Hemoglobin: 10.3 g/dL — ABNORMAL LOW (ref 12.0–15.0)
Immature Granulocytes: 0 %
Lymphocytes Relative: 40 %
Lymphs Abs: 1.6 10*3/uL (ref 0.7–4.0)
MCH: 27 pg (ref 26.0–34.0)
MCHC: 31.6 g/dL (ref 30.0–36.0)
MCV: 85.6 fL (ref 80.0–100.0)
Monocytes Absolute: 0.4 10*3/uL (ref 0.1–1.0)
Monocytes Relative: 10 %
Neutro Abs: 2 10*3/uL (ref 1.7–7.7)
Neutrophils Relative %: 48 %
Platelets: 170 10*3/uL (ref 150–400)
RBC: 3.81 MIL/uL — ABNORMAL LOW (ref 3.87–5.11)
RDW: 14.6 % (ref 11.5–15.5)
WBC: 4.1 10*3/uL (ref 4.0–10.5)
nRBC: 0 % (ref 0.0–0.2)

## 2023-03-09 LAB — URINALYSIS, W/ REFLEX TO CULTURE (INFECTION SUSPECTED)
Bacteria, UA: NONE SEEN
Bilirubin Urine: NEGATIVE
Glucose, UA: NEGATIVE mg/dL
Hgb urine dipstick: NEGATIVE
Ketones, ur: NEGATIVE mg/dL
Leukocytes,Ua: NEGATIVE
Nitrite: NEGATIVE
Protein, ur: NEGATIVE mg/dL
Specific Gravity, Urine: 1.001 — ABNORMAL LOW (ref 1.005–1.030)
pH: 7 (ref 5.0–8.0)

## 2023-03-09 LAB — PROTIME-INR
INR: 1 (ref 0.8–1.2)
Prothrombin Time: 13.3 s (ref 11.4–15.2)

## 2023-03-09 LAB — APTT: aPTT: 21 s — ABNORMAL LOW (ref 24–36)

## 2023-03-09 MED ORDER — ACETAMINOPHEN 500 MG PO TABS
1000.0000 mg | ORAL_TABLET | Freq: Once | ORAL | Status: AC
  Administered 2023-03-10: 1000 mg via ORAL
  Filled 2023-03-09: qty 2

## 2023-03-09 MED ORDER — LACTATED RINGERS IV BOLUS
1000.0000 mL | Freq: Once | INTRAVENOUS | Status: AC
  Administered 2023-03-10: 1000 mL via INTRAVENOUS

## 2023-03-09 NOTE — ED Triage Notes (Signed)
From home by ems. Neibors report lethargy and nausea. Pt hot to touch. Language DAKA. Skin hot to touch. Very little [place]. Lung sounds clear slight expiratory grunt. No allergies. Given ns in route.

## 2023-03-09 NOTE — ED Triage Notes (Signed)
Pt is NOT hot to touch at all

## 2023-03-09 NOTE — ED Provider Notes (Signed)
[place] [place] [facility] EMERGENCY DEPARTMENT AT [facility] Provider Note   CSN: [id number] Arrival date & time: 03/09/23  2146     History Chief Complaint  Patient presents with   Fatigue    HPI Jennea Gwilliam is a 67 y.o. female presenting for lethargy from home. Initially, patient was called in by EMS as a code sepsis for lethargy, warm to the touch skin poor p.o. intake and malaise.  However patient with an Arabic interpreter just states that she is having some mild weakness and fatigue over the last 3 days and has not been feeling like doing things around the house so her neighbors called EMS and she could not refused and she does not speak [place].  She said she is sick and has felt bad. Has malaise and dizziness.  Currently asymptomatic in the emergency department has been able to walk to the bathroom without difficulty denies nausea vomiting syncope shortness of breath or chest pain at this time.  No known sick contacts.  Does not have any other family in town.   Patient's recorded medical, surgical, social, medication list and allergies were reviewed in the Snapshot window as part of the initial history.   Review of Systems   Review of Systems  Constitutional:  Positive for fever. Negative for chills.  HENT:  Negative for ear pain and sore throat.   Eyes:  Negative for pain and visual disturbance.  Respiratory:  Negative for cough and shortness of breath.   Cardiovascular:  Negative for chest pain and palpitations.  Gastrointestinal:  Negative for abdominal pain and vomiting.  Genitourinary:  Negative for dysuria and hematuria.  Musculoskeletal:  Negative for arthralgias and back pain.  Skin:  Negative for color change and rash.  Neurological:  Positive for headaches. Negative for seizures and syncope.  All other systems reviewed and are negative.   Physical Exam Updated Vital Signs BP (!) 148/80 (BP Location: Right Arm)   Pulse 90   Temp 98.9 F (37.2 C) (Oral)   Resp 18    Ht 5\' 2"  (1.575 m)   Wt 63.5 kg   SpO2 100%   BMI 25.61 kg/m  Physical Exam Vitals and nursing note reviewed.  Constitutional:      General: She is not in acute distress.    Appearance: She is well-developed.  HENT:     Head: Normocephalic and atraumatic.  Eyes:     Conjunctiva/sclera: Conjunctivae normal.  Cardiovascular:     Rate and Rhythm: Normal rate and regular rhythm.     Heart sounds: No murmur heard. Pulmonary:     Effort: Pulmonary effort is normal. No respiratory distress.     Breath sounds: Normal breath sounds.  Abdominal:     General: There is no distension.     Palpations: Abdomen is soft.     Tenderness: There is no abdominal tenderness. There is no right CVA tenderness or left CVA tenderness.  Musculoskeletal:        General: No swelling or tenderness. Normal range of motion.     Cervical back: Neck supple.  Skin:    General: Skin is warm and dry.  Neurological:     General: No focal deficit present.     Mental Status: She is alert and oriented to person, place, and time. Mental status is at baseline.     Cranial Nerves: No cranial nerve deficit.      ED Course/ Medical Decision Making/ A&P    Procedures Procedures   Medications  Ordered in ED Medications  lactated ringers bolus 1,000 mL (has no administration in time range)  acetaminophen (TYLENOL) tablet 1,000 mg (has no administration in time range)   Medical Decision Making:   Rebacca Mangel is a 67 y.o. female who presented to the ED today with altered mental status detailed above.    Handoff received from EMS.  Patient placed on continuous vitals and telemetry monitoring while in ED which was reviewed periodically.  Complete initial physical exam performed, notably the patient  was hemodynamically stable in no acute distress.  Per EMS she was initially hypotensive but this responded to IV fluid and transfer.    Reviewed and confirmed nursing documentation for past medical history, family  history, social history.    Initial Assessment:   With the patient's presentation of altered mental status, most likely diagnosis is delerium 2/2 infectious etiology (UTI/CAP/URI) vs metabolic abnormality (Na/K/Mg/Ca) vs nonspecific etiology. Other diagnoses were considered including (but not limited to) CVA, ICH, intracranial mass, critical dehydration, heptatic dysfunction, uremia, hypercarbia, intoxication, endrocrine abnormality, toxidrome. These are considered less likely due to history of present illness and physical exam findings.   This is most consistent with an acute life/limb threatening illness complicated by underlying chronic conditions.  Initial Plan:  CTH to evaluate for intracranial etiology of patient's symptoms  Screening labs including CBC and Metabolic panel to evaluate for infectious or metabolic etiology of disease.  Urinalysis with reflex culture ordered to evaluate for UTI or relevant urologic/nephrologic pathology.  CXR to evaluate for structural/infectious intrathoracic pathology.  Blood cultures per potential code sepsis order set however after further history, code sepsis does not seem indicated in this patient EKG to evaluate for cardiac pathology Objective evaluation as below reviewed   Initial Study Results:   Laboratory  All laboratory results reviewed without evidence of clinically relevant pathology.    EKG EKG was reviewed independently. Rate, rhythm, axis, intervals all examined and without medically relevant abnormality. ST segments without concerns for elevations.    Radiology:  All images reviewed independently. Agree with radiology report at this time.   DG Chest Port 1 View  Result Date: 03/09/2023 CLINICAL DATA:  Questionable sepsis EXAM: PORTABLE CHEST 1 VIEW COMPARISON:  Chest x-ray 07/28/2021 FINDINGS: Heart is enlarged. There some atelectasis in the lung bases. There is no pleural effusion or pneumothorax. There is likely a small hiatal hernia.  There is dextroconvex curvature of the thoracic spine. IMPRESSION: 1. Cardiomegaly with bibasilar atelectasis. 2. Probable small hiatal hernia. Electronically Signed   By: Darliss Cheney M.D.   On: 03/09/2023 22:44     Final Assessment and Plan:   Discussed with patient that she likely has a viral syndrome versus URI and was light headed likely secondary to dehydration from her poor p.o. intake.  She has improved remarkably well in the emergency department with IV fluids and antipyretics.  Objective studies are being performed but some results are still pending.  Pending any critical findings anticipate patient will be discharged by oncoming provider.  Care of patient handed off to overnight provider.    Clinical Impression:  1. Generalized weakness   2. Other fatigue   3. Viral syndrome      Data Unavailable   Final Clinical Impression(s) / ED Diagnoses Final diagnoses:  Generalized weakness  Other fatigue  Viral syndrome    Rx / DC Orders ED Discharge Orders     None         Glyn Ade, MD 03/09/23 2359

## 2023-03-10 ENCOUNTER — Emergency Department (HOSPITAL_COMMUNITY): Payer: Medicaid Other

## 2023-03-10 LAB — I-STAT CG4 LACTIC ACID, ED: Lactic Acid, Venous: 0.6 mmol/L (ref 0.5–1.9)

## 2023-03-10 LAB — SARS CORONAVIRUS 2 BY RT PCR: SARS Coronavirus 2 by RT PCR: NEGATIVE

## 2023-03-10 NOTE — Congregational Nurse Program (Addendum)
  Dept: [phone]   Congregational Nurse Program Note  Date of Encounter: 03/10/2023  Past Medical History: Past Medical History:  Diagnosis Date   GERD (gastroesophageal reflux disease)    Hypertension    Vision impairment     Encounter Details:  Appointment scheduled with PCP for October 4th at 0910 am. Patient made aware.  Nicole Cella Cloee Dunwoody RN BSN PCCN  [facility] [phone]-cell [phone]-office

## 2023-03-10 NOTE — ED Provider Notes (Signed)
12:11 AM Assumed care from Dr. Doran Durand, please see their note for full history, physical and decision making until this point. In brief this is a 67 y.o. year old female who presented to the ED tonight with Fatigue     Decreased intake, sleepy, headache. EMS called by neighbor. Most symptoms improved with fluids. Pending IVF and CT and likely discharge. CT reassuring. No new complaints, still feels well. D/c per previous provider's plan.   Discharge instructions, including strict return precautions for new or worsening symptoms, given. Patient and/or family verbalized understanding and agreement with the plan as described.   Labs, studies and imaging reviewed by myself and considered in medical decision making if ordered. Imaging interpreted by radiology.  Labs Reviewed  COMPREHENSIVE METABOLIC PANEL - Abnormal; Notable for the following components:      Result Value   Potassium 3.4 (*)    Glucose, Bld 105 (*)    BUN 5 (*)    Calcium 8.0 (*)    Total Protein 8.2 (*)    Albumin 3.3 (*)    All other components within normal limits  CBC WITH DIFFERENTIAL/PLATELET - Abnormal; Notable for the following components:   RBC 3.81 (*)    Hemoglobin 10.3 (*)    HCT 32.6 (*)    All other components within normal limits  APTT - Abnormal; Notable for the following components:   aPTT 21 (*)    All other components within normal limits  URINALYSIS, W/ REFLEX TO CULTURE (INFECTION SUSPECTED) - Abnormal; Notable for the following components:   Color, Urine COLORLESS (*)    Specific Gravity, Urine 1.001 (*)    All other components within normal limits  CULTURE, BLOOD (ROUTINE X 2)  CULTURE, BLOOD (ROUTINE X 2)  SARS CORONAVIRUS 2 BY RT PCR  PROTIME-INR  I-STAT CG4 LACTIC ACID, ED  I-STAT CG4 LACTIC ACID, ED    DG Chest Port 1 View  Final Result    CT HEAD WO CONTRAST ( )    (Results Pending)    No follow-ups on file.    Advaith Lamarque, Barbara Cower, MD 03/11/23 [phone]

## 2023-03-10 NOTE — Congregational Nurse Program (Addendum)
  Dept: [phone]   Congregational Nurse Program Note  Date of Encounter: 03/10/2023  Past Medical History: Past Medical History:  Diagnosis Date   GERD (gastroesophageal reflux disease)    Hypertension    Vision impairment     Encounter Details:  CNP Questionnaire - 03/10/23 1013       Questionnaire   Ask client: Do you give verbal consent for me to treat you today? Yes    Student Assistance [employer] Nurse    Location Patient Served  [facility]    Visit Setting with Client Organization    Patient Status Refugee    Insurance Medicaid    Insurance/Financial Assistance Referral N/A    Medication Have Medication Insecurities;Referred to Medication Assistance    Medical Provider Yes    Screening Referrals Made N/A    Medical Referrals Made N/A    Medical Appointment Made [facility] PCP/clinic    Recently w/o PCP, now 1st time PCP visit completed due to CNs referral or appointment made N/A    Food Have Food Insecurities;Referred to Food Pantry    Transportation Need transportation assistance;Provided transportation assistance    Housing/Utilities Unable to pay for utilities    Interpersonal Safety N/A    Interventions Advocate/Support;Navigate Healthcare System;Case Management;Counsel;Educate;Reviewed Medications    Abnormal to Normal Screening Since Last CN Visit N/A    Screenings CN Performed Blood Pressure    Sent Client to Lab for: N/A    Did client attend any of the following based off CNs referral or appointments made? Transportation    ED Visit Averted Yes    Life-Saving Intervention Made N/A                Patient  went to ER yesterday due to fatigue, congregational RN will assist with making a follow up appointment with PCP, BP was elevated to 170/86. She reports to be compliant with medications. Bus tickets given for transportation and states she is familiar with bus ride from home to PCP.  Nicole Cella Bartlett Enke RN BSN PCCN  [facility] [phone]  [phone]-cell [phone]-office

## 2023-03-14 LAB — CULTURE, BLOOD (ROUTINE X 2)
Culture: NO GROWTH
Special Requests: ADEQUATE

## 2023-03-16 ENCOUNTER — Other Ambulatory Visit: Payer: Self-pay

## 2023-03-16 ENCOUNTER — Emergency Department (HOSPITAL_COMMUNITY): Payer: Medicaid Other

## 2023-03-16 ENCOUNTER — Emergency Department (HOSPITAL_COMMUNITY)
Admission: EM | Admit: 2023-03-16 | Discharge: 2023-03-16 | Disposition: A | Discharge status: Home / Self Care | Payer: Medicaid Other | Attending: Emergency Medicine | Admitting: Emergency Medicine

## 2023-03-16 DIAGNOSIS — R42 Dizziness and giddiness: Secondary | ICD-10-CM | POA: Diagnosis present

## 2023-03-16 DIAGNOSIS — Z79899 Other long term (current) drug therapy: Secondary | ICD-10-CM | POA: Insufficient documentation

## 2023-03-16 DIAGNOSIS — Z20822 Contact with and (suspected) exposure to covid-19: Secondary | ICD-10-CM | POA: Insufficient documentation

## 2023-03-16 DIAGNOSIS — I10 Essential (primary) hypertension: Secondary | ICD-10-CM | POA: Diagnosis not present

## 2023-03-16 LAB — COMPREHENSIVE METABOLIC PANEL
ALT: 12 U/L (ref 0–44)
AST: 23 U/L (ref 15–41)
Albumin: 3.4 g/dL — ABNORMAL LOW (ref 3.5–5.0)
Alkaline Phosphatase: 94 U/L (ref 38–126)
Anion gap: 11 (ref 5–15)
BUN: 7 mg/dL — ABNORMAL LOW (ref 8–23)
CO2: 23 mmol/L (ref 22–32)
Calcium: 8.8 mg/dL — ABNORMAL LOW (ref 8.9–10.3)
Chloride: 106 mmol/L (ref 98–111)
Creatinine, Ser: 0.81 mg/dL (ref 0.44–1.00)
GFR, Estimated: >60 mL/min (ref >60)
Glucose, Bld: 113 mg/dL — ABNORMAL HIGH (ref 70–99)
Potassium: 3.9 mmol/L (ref 3.5–5.1)
Sodium: 140 mmol/L (ref 135–145)
Total Bilirubin: 0.5 mg/dL (ref 0.3–1.2)
Total Protein: 7.1 g/dL (ref 6.5–8.1)

## 2023-03-16 LAB — URINALYSIS, W/ REFLEX TO CULTURE (INFECTION SUSPECTED)
Bacteria, UA: NONE SEEN
Bilirubin Urine: NEGATIVE
Glucose, UA: NEGATIVE mg/dL
Hgb urine dipstick: NEGATIVE
Ketones, ur: NEGATIVE mg/dL
Leukocytes,Ua: NEGATIVE
Nitrite: NEGATIVE
Protein, ur: NEGATIVE mg/dL
Specific Gravity, Urine: 1.004 — ABNORMAL LOW (ref 1.005–1.030)
pH: 8 (ref 5.0–8.0)

## 2023-03-16 LAB — CBC WITH DIFFERENTIAL/PLATELET
Abs Immature Granulocytes: 0.01 10*3/uL (ref 0.00–0.07)
Basophils Absolute: 0 10*3/uL (ref 0.0–0.1)
Basophils Relative: 0 %
Eosinophils Absolute: 0 10*3/uL (ref 0.0–0.5)
Eosinophils Relative: 1 %
HCT: 36.2 % (ref 36.0–46.0)
Hemoglobin: 11.3 g/dL — ABNORMAL LOW (ref 12.0–15.0)
Immature Granulocytes: 0 %
Lymphocytes Relative: 40 %
Lymphs Abs: 1.2 10*3/uL (ref 0.7–4.0)
MCH: 27.1 pg (ref 26.0–34.0)
MCHC: 31.2 g/dL (ref 30.0–36.0)
MCV: 86.8 fL (ref 80.0–100.0)
Monocytes Absolute: 0.3 10*3/uL (ref 0.1–1.0)
Monocytes Relative: 8 %
Neutro Abs: 1.6 10*3/uL — ABNORMAL LOW (ref 1.7–7.7)
Neutrophils Relative %: 51 %
Platelets: 182 10*3/uL (ref 150–400)
RBC: 4.17 MIL/uL (ref 3.87–5.11)
RDW: 14.9 % (ref 11.5–15.5)
WBC: 3.1 10*3/uL — ABNORMAL LOW (ref 4.0–10.5)
nRBC: 0 % (ref 0.0–0.2)

## 2023-03-16 LAB — CULTURE, BLOOD (ROUTINE X 2): Special Requests: ADEQUATE

## 2023-03-16 LAB — CBG MONITORING, ED: Glucose-Capillary: 103 mg/dL — ABNORMAL HIGH (ref 70–99)

## 2023-03-16 LAB — I-STAT CG4 LACTIC ACID, ED
Lactic Acid, Venous: 1.6 mmol/L (ref 0.5–1.9)
Lactic Acid, Venous: 1.5 mmol/L (ref 0.5–1.9)

## 2023-03-16 LAB — TROPONIN I (HIGH SENSITIVITY)
Troponin I (High Sensitivity): 5 ng/L (ref <18)
Troponin I (High Sensitivity): 5 ng/L (ref <18)

## 2023-03-16 LAB — RESP PANEL BY RT-PCR (RSV, FLU A&B, COVID)  RVPGX2
Influenza A by PCR: NEGATIVE
Influenza B by PCR: NEGATIVE
Resp Syncytial Virus by PCR: NEGATIVE
SARS Coronavirus 2 by RT PCR: NEGATIVE

## 2023-03-16 MED ORDER — ACETAMINOPHEN 500 MG PO TABS: 1000.0000 mg | ORAL_TABLET | Freq: Four times a day (QID) | ORAL | 0 refills | Status: DC | PRN

## 2023-03-16 MED ORDER — MECLIZINE HCL 25 MG PO TABS: 25.0000 mg | ORAL_TABLET | Freq: Three times a day (TID) | ORAL | 0 refills | Status: DC | PRN

## 2023-03-16 NOTE — ED Notes (Signed)
PT provided a wheelchair and assisted to bathroom by family per PT's request.

## 2023-03-16 NOTE — ED Provider Triage Note (Signed)
Emergency Medicine Provider Triage Evaluation Note  Emily Newman , a 67 y.o. female  was evaluated in triage.  Pt complains of generalized weakness, unsteady gait.  Patient's grandson is at bedside.  He provides translation for the patient.  Patient with persistent symptoms since last ED evaluation.  Patient now needs to hold onto the wall in order to ambulate.  Prior to the last week she was able to ambulate without needing any assistance.  Per grandson, patient continues to complain of dizziness, weakness.  Review of Systems  Positive: Generalized weakness, unsteady gait, persistent symptoms since last ED evaluation Negative: No fever, nausea, vomiting, pain  Physical Exam  BP (!) 149/73 (BP Location: Left Arm)   Pulse 74   Temp 98.2 F (36.8 C)   Resp 16   SpO2 100%  Gen:   Awake, no distress   Resp:  Normal effort  MSK:   Moves extremities without difficulty  Other:  Unsteady with standing.  Patient requires 2 person assist to maintain upright posture during attempted ambulation.  Medical Decision Making  Medically screening exam initiated at 9:36 AM.  Appropriate orders placed.  Emily Newman was informed that the remainder of the evaluation will be completed by another provider, this initial triage assessment does not replace that evaluation, and the importance of remaining in the ED until their evaluation is complete.  Patient with persistent generalized weakness, unsteady gait.  Patient with recent ED evaluation that was thorough and without specific abnormality identified.  Will order repeat screening labs and repeat CT imaging of brain.  However, patient may require additional workup including possible MRI brain.  Patient and family understand that Provider in triage screening is initiation of care.  Additional evaluation and workup may be required.   Emily Fines, MD 03/16/23 [phone]

## 2023-03-16 NOTE — ED Triage Notes (Signed)
Pt bib [facility] from home where she originally complained of a fever but has not had a fever with EMS or in ED. Pt states she started feeling dizzy and right sided chest pain at 0500 today. Pt speaks arabic but son is here to translate.

## 2023-03-16 NOTE — Discharge Instructions (Signed)
We evaluated you for your dizziness.  Your testing was reassuring.  Please follow-up closely with your primary doctor.  Please take at 1000 mg of Tylenol every 6 hours as needed for pain.  You can buy this medicine over-the-counter.  Please return if you have any new or worsening symptoms.  ??? ???? ?????? ????? ?? ??? ??????. ????? ????? ???????? ??????. ???? ???????? ?? ??? ?? ????? ???????. ???? ????? 1000 ??? ?? ???????? ?? 6 ????? ??? ?????? ?????? ?????. ????? ???? ??? ?????? ???? ???? ????. ???? ?????? ??? ???? ???? ?? ????? ????? ?? ???????.

## 2023-03-16 NOTE — ED Provider Notes (Signed)
[place] EMERGENCY DEPARTMENT AT [facility] Provider Note  CSN: [id number] Arrival date & time: 03/16/23 8657  Chief Complaint(s) Dizziness  HPI Emily Newman is a 67 y.o. female history of hypertension presenting to the emergency department with lightheadedness.  Patient reports she was lightheaded starting today.  She also reports right ankle pain, right upper back pain.  No chest pain, difficulty breathing, nausea or vomiting, fevers or chills, headaches, abdominal pain, urinary symptoms, leg swelling.  No numbness or tingling.  Grandson reported some unsteadiness.  Arabic interpreter used.   Past Medical History Past Medical History:  Diagnosis Date   GERD (gastroesophageal reflux disease)    Hypertension    Vision impairment    Patient Active Problem List   Diagnosis Date Noted   Osteoarthritis of right knee 04/05/2021   Essential hypertension 07/17/2020   Vision impairment 07/17/2020   Thyroid nodule 07/17/2020   Refugee health examination 07/16/2020   Home Medication(s) Prior to Admission medications   Medication Sig Start Date End Date Taking? Authorizing Provider  acetaminophen (TYLENOL) 500 MG tablet Take 2 tablets (1,000 mg total) by mouth every 6 (six) hours as needed. 03/16/23  Yes Lonell Grandchild, MD  meclizine (ANTIVERT) 25 MG tablet Take 1 tablet (25 mg total) by mouth 3 (three) times daily as needed for dizziness. 03/16/23  Yes Lonell Grandchild, MD  amLODipine-olmesartan (AZOR) 5-20 MG tablet Take 1 tablet by mouth daily. 10/12/22   Westley Chandler, MD  diclofenac Sodium (VOLTAREN) 1 % GEL Apply four times daily to knee 06/24/22   Carney Living, MD  fluticasone (FLONASE) 50 MCG/ACT nasal spray Place 2 sprays into both nostrils daily. Patient not taking: Reported on 12/08/2022 03/13/22   Westley Chandler, MD  levocetirizine Elita Boone ALLERGY 24HR) 5 MG tablet Take 1 tablet (5 mg total) by mouth every evening. Patient not taking: Reported on  12/08/2022 01/09/22   Westley Chandler, MD  omeprazole (PRILOSEC) 40 MG capsule Take 1 capsule (40 mg total) by mouth daily. 10/12/22   Westley Chandler, MD  triamcinolone ointment (KENALOG) 0.5 % Apply 1 application topically daily. Patient not taking: Reported on 01/02/2022 05/20/21   Westley Chandler, MD  amLODipine (NORVASC) 5 MG tablet Take 1 tablet (5 mg total) by mouth at bedtime. 12/24/20   Westley Chandler, MD                                                                                                                                    Past Surgical History Past Surgical History:  Procedure Laterality Date   SMALL INTESTINE SURGERY     ? SBO in [place] and had bowel resection    STOMACH SURGERY  03/2019   completed in [place]    THYROIDECTOMY     R lobe by exam    Family History No family history on file.  Social History Social History  Tobacco Use   Smoking status: Never   Smokeless tobacco: Never  Substance Use Topics   Alcohol use: Never   Drug use: Never   Allergies Patient has no known allergies.  Review of Systems Review of Systems  All other systems reviewed and are negative.   Physical Exam Vital Signs  I have reviewed the triage vital signs BP (!) 142/71 (BP Location: Left Arm)   Pulse 63   Temp 98.2 F (36.8 C) (Oral)   Resp 16   SpO2 99%  Physical Exam Vitals and nursing note reviewed.  Constitutional:      General: She is not in acute distress.    Appearance: She is well-developed.  HENT:     Head: Normocephalic and atraumatic.     Mouth/Throat:     Mouth: Mucous membranes are moist.  Eyes:     Pupils: Pupils are equal, round, and reactive to light.  Cardiovascular:     Rate and Rhythm: Normal rate and regular rhythm.     Heart sounds: No murmur heard. Pulmonary:     Effort: Pulmonary effort is normal. No respiratory distress.     Breath sounds: Normal breath sounds.  Abdominal:     General: Abdomen is flat.     Palpations: Abdomen is soft.      Tenderness: There is no abdominal tenderness.  Musculoskeletal:        General: No tenderness.     Right lower leg: No edema.     Left lower leg: No edema.     Comments: Mild right thoracic paraspinal tenderness without deformity.  Right ankle atraumatic without deformity or tenderness.  Skin:    General: Skin is warm and dry.  Neurological:     General: No focal deficit present.     Mental Status: She is alert. Mental status is at baseline.  Psychiatric:        Mood and Affect: Mood normal.        Behavior: Behavior normal.     ED Results and Treatments Labs (all labs ordered are listed, but only abnormal results are displayed) Labs Reviewed  CBC WITH DIFFERENTIAL/PLATELET - Abnormal; Notable for the following components:      Result Value   WBC 3.1 (*)    Hemoglobin 11.3 (*)    Neutro Abs 1.6 (*)    All other components within normal limits  COMPREHENSIVE METABOLIC PANEL - Abnormal; Notable for the following components:   Glucose, Bld 113 (*)    BUN 7 (*)    Calcium 8.8 (*)    Albumin 3.4 (*)    All other components within normal limits  URINALYSIS, W/ REFLEX TO CULTURE (INFECTION SUSPECTED) - Abnormal; Notable for the following components:   Color, Urine COLORLESS (*)    Specific Gravity, Urine 1.004 (*)    All other components within normal limits  CBG MONITORING, ED - Abnormal; Notable for the following components:   Glucose-Capillary 103 (*)    All other components within normal limits  RESP PANEL BY RT-PCR (RSV, FLU A&B, COVID)  RVPGX2  CULTURE, BLOOD (ROUTINE X 2)  CULTURE, BLOOD (ROUTINE X 2)  I-STAT CG4 LACTIC ACID, ED  I-STAT CG4 LACTIC ACID, ED  TROPONIN I (HIGH SENSITIVITY)  TROPONIN I (HIGH SENSITIVITY)  Radiology MR BRAIN WO CONTRAST  Result Date: 03/16/2023 CLINICAL DATA:  Neuro deficit, acute, stroke suspected EXAM: MRI HEAD  WITHOUT CONTRAST TECHNIQUE: Multiplanar, multiecho pulse sequences of the brain and surrounding structures were obtained without intravenous contrast. COMPARISON:  same day CT head. FINDINGS: Brain: No acute infarction, hemorrhage, hydrocephalus, extra-axial collection or mass lesion. Vascular: Major arterial flow voids are maintained skull base. Skull and upper cervical spine: Normal marrow signal. Sinuses/Orbits: Left sphenoid sinus mucosal thickening. No acute orbital findings. Other: None. IMPRESSION: No evidence of acute intracranial abnormality. Electronically Signed   By: Feliberto Harts M.D.   On: 03/16/2023 14:24   CT Head Wo Contrast  Result Date: 03/16/2023 CLINICAL DATA:  Mental status change, unknown cause EXAM: CT HEAD WITHOUT CONTRAST TECHNIQUE: Contiguous axial images were obtained from the base of the skull through the vertex without intravenous contrast. RADIATION DOSE REDUCTION: This exam was performed according to the departmental dose-optimization program which includes automated exposure control, adjustment of the mA and/or kV according to patient size and/or use of iterative reconstruction technique. COMPARISON:  CT 03/10/2023. FINDINGS: Brain: No evidence of acute infarction, hemorrhage, hydrocephalus, extra-axial collection or mass lesion/mass effect. Vascular: No hyperdense vessel. Skull: No acute fracture. Sinuses/Orbits: Left sphenoid sinus mucosal thickening with surrounding osteitis. No acute orbital findings. IMPRESSION: 1. No evidence of acute intracranial abnormality. 2. Probable chronic left sphenoid sinusitis. Electronically Signed   By: Feliberto Harts M.D.   On: 03/16/2023 12:47   DG Chest 2 View  Result Date: 03/16/2023 CLINICAL DATA:  Right chest pain and dizziness EXAM: CHEST - 2 VIEW COMPARISON:  Chest radiograph dated 03/09/2023 FINDINGS: Low lung volumes. Increased bilateral interstitial and bibasilar patchy opacities. No pleural effusion or pneumothorax.  Similar cardiomediastinal silhouette. Similar dextroscoliosis of the thoracic spine. IMPRESSION: Increased bilateral interstitial and bibasilar patchy opacities, which may represent a combination of atelectasis, pulmonary edema, and/or infection. Electronically Signed   By: Agustin Cree M.D.   On: 03/16/2023 11:12    Pertinent labs & imaging results that were available during my care of the patient were reviewed by me and considered in my medical decision making (see MDM for details).  Medications Ordered in ED Medications - No data to display                                                                                                                                   Procedures Procedures  (including critical care time)  Medical Decision Making / ED Course   MDM:  67 year old presenting with lightheadedness, right upper back pain, right ankle pain.  Unclear cause of symptoms, physical examination overall reassuring.  Does have some tenderness to the right thoracic back.  Lungs are clear on exam.  Chest x-ray read as interstitial opacities, on my evaluation does not look very different than previous x-ray, suspect this would most likely represent atelectasis given clear lungs.  Symptoms most likely musculoskeletal in nature.  Labs so far reassuring.  Blood cultures were obtained in triage for unclear reason.  I have an extremely low concern for sepsis.  Patient afebrile, not tachycardic and denies any symptoms like cough, abdominal pain, dysuria, fevers or chills.  Patient had complained of some dizziness, will attempt ambulation trial.  The patient has persistent trouble ambulation as was noted in triage evaluation will need MRI.  Clinical Course as of 03/16/23 1502  Tue Mar 16, 2023  1301 Workup so far reassuring.  Ambulated patient, she did have some unsteadiness with ambulation, needed assistance from her grandson to ambulate to the bathroom.  Patient reports she feels okay, but grandson  reports that this is not her normal ambulation.  Will go ahead and order MRI to evaluate for any occult stroke.  If this is negative, anticipate discharge. [WS]  1456 MRI is normal without evidence of acute stroke.  She continues to feel well. Will discharge patient to home. All questions answered. Patient comfortable with plan of discharge. Return precautions discussed with patient and specified on the after visit summary.  [WS]    Clinical Course User Index [WS] Suezanne Jacquet, Jerilee Field, MD     Additional history obtained: -Additional history obtained from family -External records from outside source obtained and reviewed including: Chart review including previous notes, labs, imaging, consultation notes including previous ER visit   Lab Tests: -I ordered, reviewed, and interpreted labs.   The pertinent results include:   Labs Reviewed  CBC WITH DIFFERENTIAL/PLATELET - Abnormal; Notable for the following components:      Result Value   WBC 3.1 (*)    Hemoglobin 11.3 (*)    Neutro Abs 1.6 (*)    All other components within normal limits  COMPREHENSIVE METABOLIC PANEL - Abnormal; Notable for the following components:   Glucose, Bld 113 (*)    BUN 7 (*)    Calcium 8.8 (*)    Albumin 3.4 (*)    All other components within normal limits  URINALYSIS, W/ REFLEX TO CULTURE (INFECTION SUSPECTED) - Abnormal; Notable for the following components:   Color, Urine COLORLESS (*)    Specific Gravity, Urine 1.004 (*)    All other components within normal limits  CBG MONITORING, ED - Abnormal; Notable for the following components:   Glucose-Capillary 103 (*)    All other components within normal limits  RESP PANEL BY RT-PCR (RSV, FLU A&B, COVID)  RVPGX2  CULTURE, BLOOD (ROUTINE X 2)  CULTURE, BLOOD (ROUTINE X 2)  I-STAT CG4 LACTIC ACID, ED  I-STAT CG4 LACTIC ACID, ED  TROPONIN I (HIGH SENSITIVITY)  TROPONIN I (HIGH SENSITIVITY)    Notable for nonspecific leukopenia. Normal UA. Normal  lactate/troponin  EKG  Normal ECG  Imaging Studies ordered: I ordered imaging studies including MRI brain, CXR, CT head On my interpretation imaging demonstrates no acute process I independently visualized and interpreted imaging. I agree with the radiologist interpretation   Medicines ordered and prescription drug management: Meds ordered this encounter  Medications   acetaminophen (TYLENOL) 500 MG tablet    Sig: Take 2 tablets (1,000 mg total) by mouth every 6 (six) hours as needed.    Dispense:  30 tablet    Refill:  0   meclizine (ANTIVERT) 25 MG tablet    Sig: Take 1 tablet (25 mg total) by mouth 3 (three) times daily as needed for dizziness.    Dispense:  30 tablet    Refill:  0    -I have reviewed the  patients home medicines and have made adjustments as needed   Social Determinants of Health:  Diagnosis or treatment significantly limited by social determinants of health: refugee   Reevaluation: After the interventions noted above, I reevaluated the patient and found that their symptoms have resolved  Co morbidities that complicate the patient evaluation  Past Medical History:  Diagnosis Date   GERD (gastroesophageal reflux disease)    Hypertension    Vision impairment       Dispostion: Disposition decision including need for hospitalization was considered, and patient discharged from emergency department.    Final Clinical Impression(s) / ED Diagnoses Final diagnoses:  Dizziness     This chart was dictated using voice recognition software.  Despite best efforts to proofread,  errors can occur which can change the documentation meaning.    Lonell Grandchild, MD 03/16/23 [phone]

## 2023-03-17 ENCOUNTER — Telehealth (HOSPITAL_BASED_OUTPATIENT_CLINIC_OR_DEPARTMENT_OTHER): Payer: Self-pay | Admitting: *Deleted

## 2023-03-17 NOTE — Telephone Encounter (Signed)
Post ED Visit - Positive Culture Follow-up  Culture report reviewed by antimicrobial stewardship pharmacist: [facility] [x]  Ruben Im, Pharm.D. []  Celedonio Miyamoto, Pharm.D., BCPS AQ-ID []  Garvin Fila, Pharm.D., BCPS []  Georgina Pillion, Pharm.D., BCPS []  Rupert, [address].D., BCPS, AAHIVP []  Estella Husk, Pharm.D., BCPS, AAHIVP []  Lysle Pearl, PharmD, BCPS []  Phillips Climes, PharmD, BCPS []  Agapito Games, PharmD, BCPS []  Verlan Friends, PharmD []  Mervyn Gay, PharmD, BCPS []  Vinnie Level, PharmD  [facility] []  Len Childs, PharmD []  Greer Pickerel, PharmD []  Adalberto Cole, PharmD []  Perlie Gold, Rph []  Lonell Face) Jean Rosenthal, PharmD []  Earl Many, PharmD []  Junita Push, PharmD []  Dorna Leitz, PharmD []  Terrilee Files, PharmD []  Lynann Beaver, PharmD []  Keturah Barre, PharmD []  Loralee Pacas, PharmD []  Bernadene Person, PharmD   Positive blood culture Likely contaminent, no intervention and no further patient follow-up is required at this time.  Lorre Nick, MD  Virl Axe Talley 03/17/2023, 2:15 PM

## 2023-03-21 LAB — CULTURE, BLOOD (ROUTINE X 2)
Culture: NO GROWTH
Culture: NO GROWTH
Special Requests: ADEQUATE
Special Requests: ADEQUATE

## 2023-04-02 ENCOUNTER — Other Ambulatory Visit: Payer: Self-pay

## 2023-04-02 ENCOUNTER — Ambulatory Visit (INDEPENDENT_AMBULATORY_CARE_PROVIDER_SITE_OTHER): Payer: Medicaid Other | Admitting: Family Medicine

## 2023-04-02 ENCOUNTER — Encounter: Payer: Self-pay | Admitting: Family Medicine

## 2023-04-02 VITALS — BP 143/75 | HR 71 | Ht 62.0 in | Wt 138.6 lb

## 2023-04-02 DIAGNOSIS — I10 Essential (primary) hypertension: Secondary | ICD-10-CM | POA: Diagnosis not present

## 2023-04-02 DIAGNOSIS — R053 Chronic cough: Secondary | ICD-10-CM | POA: Diagnosis not present

## 2023-04-02 DIAGNOSIS — D649 Anemia, unspecified: Secondary | ICD-10-CM

## 2023-04-02 DIAGNOSIS — E041 Nontoxic single thyroid nodule: Secondary | ICD-10-CM | POA: Diagnosis not present

## 2023-04-02 DIAGNOSIS — Z23 Encounter for immunization: Secondary | ICD-10-CM

## 2023-04-02 DIAGNOSIS — K219 Gastro-esophageal reflux disease without esophagitis: Secondary | ICD-10-CM | POA: Diagnosis not present

## 2023-04-02 MED ORDER — OMEPRAZOLE 40 MG PO CPDR: 40.0000 mg | DELAYED_RELEASE_CAPSULE | Freq: Every day | ORAL | 3 refills | Status: DC

## 2023-04-02 MED ORDER — AMLODIPINE-OLMESARTAN 5-20 MG PO TABS: 2.0000 | ORAL_TABLET | Freq: Every day | ORAL | 3 refills | Status: DC

## 2023-04-02 NOTE — Progress Notes (Signed)
    SUBJECTIVE:   CHIEF COMPLAINT / HPI:   Patient presents for ED follow-up and BP management. An in-person [place] Arabic interpreter was present. Patient is accompanied by her grandson Akal.  Patient went to ED on 9/10 with weakness and 9/17 with dizziness. Patient reports that these have now resolved.  Pt has a history of a L thyroid nodule. Denies hoarseness or difficulty swallowing. Reports that she had a partial thyroidectomy in [place] before migrating to the [place]. Since then, patient has had itching in the mid back that does not resolve with lotions or creams.  Pt has a long-standing cough. Reports that it is improving and occurs a 2-3 days a week. Denies fever or night sweats. No CP, hemoptysis, nasal drainage. Does have a history of GERD for which she takes a ppi.   Patient with elevated blood pressure today and at ED visits. Does not have a blood pressure cuff at home to check blood pressure with. Denies headache, chest pain, shortness of breath, abdominal pain or lower extremity swelling. Takes amlodipine-olmesartan 5-20mg  daily.  PERTINENT  PMH / PSH: HTN  OBJECTIVE:   BP (!) 159/77   Pulse 71   Ht 5\' 2"  (1.575 m)   Wt 138 lb 9.6 oz (62.9 kg)   SpO2 100%   BMI 25.35 kg/m    HEENT: No sign of trauma, EOM grossly intact, no palpable thyroid nodule  Cardiac: RRR, no m/r/g Respiratory: CTAB, normal WOB, no w/c/r GI: Soft, NTTP, non-distended  Extremities: NTTP, no peripheral edema, no R or L knee swelling or tenderness Psych: Appropriate mood and affect  ASSESSMENT/PLAN:   Essential hypertension Poorly control hypertension with BP elevated today to 159/77. Will increase amlodipine-olmesartan 5/20mg  from 1 to 2 tablets daily. Follow up in 2 weeks to assess for side effects and check potassium level.  Thyroid nodule Remains asymptomatic. Will repeat thyroid ultrasound to confirm stability.  Chronic cough Patient endorsing chronic cough. Differential  includes asthma, post-nasal drip, GERD, TB, or lung malignancy. Will continue omeprazole for GERD. No night sweats or fever, so low concern for TB. Given age and chronicity, will obtain lung CT to assess for potential malignancy.   Anemia- noted in ED. Ddx includes chronic disease, iron defiency, thalessemia. CBC and iron panel with ferritin today. Denies bleeding.   HCM Flu shot today Consider DEXA at follow up  Previously declined mammogram and CSY  Lovett Sox, Medical Student [facility]   Patient seen along with [facility]. I personally evaluated this patient along with the student, and verified all aspects of the history, physical exam, and medical decision making as documented by the student. I agree with the student's documentation and have made all necessary edits.

## 2023-04-02 NOTE — Assessment & Plan Note (Signed)
Poorly control hypertension with BP elevated today to 159/77. Will increase amlodipine-olmesartan 5/20mg  from 1 to 2 tablets daily. Follow up in 2 weeks to assess for side effects and check potassium level.

## 2023-04-02 NOTE — Patient Instructions (Addendum)
It was wonderful to see you today.  Please bring ALL of your medications with you to every visit.   Today we talked about:  --Getting a CT of your lungs and thyroid ultrasound  -Increase your blood pressure medication from 1 pill to 2 pills a day  - We will check blood work    Please follow up in 2 weeks    Thank you for choosing [facility].   Please call [phone] with any questions about today's appointment.  Please be sure to schedule follow up at the front  desk before you leave today.   [name], MD  Family Medicine      ???? ????? ???? ?????? ??? ?? ?? ?????.   ?????? ????? ??:  - ????? ????? ????? ????? ?????? ?????? ????? ??????? ???????? ??? ???????  -????? ????? ??? ???? ?? ??? ????? ??? ????? ?????  - ??? ???? ???? ?????? ????    ???? ???????? ???? ???????    ????? ?? ??? ?????? [facility].   ???? ??????? ?????? [phone] ??? ???? ???? ?? ????? ??? ???? ?????.  ???? ?????? ?? ????? ???? ???????? ?? ???? ????????? ??? ??????? ?????.   ?????? ?????? ????? ?? ????  ?? ?????? kan min alraayie ruyatuk alyawma. yurjaa 'iihdar jamie 'adwiatik maeak fi kuli ziaratin. tahadathna alyawm ean: - 'iijra' taswir maqtaeiin muhawsab liriatayk wataswir alghudat aldaraqiat bialmawjat fawq alsawtia -ziadat 'adwiat daght aldam min habat wahidat 'iilaa habatayn yawmiana - sawf naqum bifahs tahalil aldam yurjaa almutabaeat khilal 'usbueayn shkran lak ealaa [facility]. Oneita Hurt [phone] 'iidha kanat ladayk 'ayu 'asyilat hawl maweid alyawmi. yurjaa alta'akud min tahdid [employer] lilmutabaeat fi maktab aliaistiqbal qabl mughadaratik alyawma. karina brawn, duktur fi altibi tibu al'usra

## 2023-04-02 NOTE — Assessment & Plan Note (Signed)
Patient endorsing chronic cough. Differential includes asthma, post-nasal drip, GERD, TB, or lung malignancy. Will continue omeprazole for GERD. No night sweats or fever, so low concern for TB. Given age and chronicity, will obtain lung CT to assess for potential malignancy.

## 2023-04-02 NOTE — Assessment & Plan Note (Signed)
Remains asymptomatic. Will repeat thyroid ultrasound to confirm stability.

## 2023-04-03 LAB — CBC WITH DIFFERENTIAL/PLATELET
Basophils Absolute: 0 10*3/uL (ref 0.0–0.2)
Basos: 0 %
EOS (ABSOLUTE): 0 10*3/uL (ref 0.0–0.4)
Eos: 1 %
Hematocrit: 35.2 % (ref 34.0–46.6)
Hemoglobin: 11.1 g/dL (ref 11.1–15.9)
Immature Grans (Abs): 0 10*3/uL (ref 0.0–0.1)
Immature Granulocytes: 0 %
Lymphocytes Absolute: 1.3 10*3/uL (ref 0.7–3.1)
Lymphs: 41 %
MCH: 27.7 pg (ref 26.6–33.0)
MCHC: 31.5 g/dL (ref 31.5–35.7)
MCV: 88 fL (ref 79–97)
Monocytes Absolute: 0.3 10*3/uL (ref 0.1–0.9)
Monocytes: 10 %
Neutrophils Absolute: 1.5 10*3/uL (ref 1.4–7.0)
Neutrophils: 48 %
Platelets: 221 10*3/uL (ref 150–450)
RBC: 4.01 x10E6/uL (ref 3.77–5.28)
RDW: 13.8 % (ref 11.7–15.4)
WBC: 3.2 10*3/uL — ABNORMAL LOW (ref 3.4–10.8)

## 2023-04-03 LAB — IRON,TIBC AND FERRITIN PANEL
Ferritin: 51 ng/mL (ref 15–150)
Iron Saturation: 24 % (ref 15–55)
Iron: 79 ug/dL (ref 27–139)
Total Iron Binding Capacity: 330 ug/dL (ref 250–450)
UIBC: 251 ug/dL (ref 118–369)

## 2023-04-05 ENCOUNTER — Encounter: Payer: Self-pay | Admitting: Family Medicine

## 2023-04-06 ENCOUNTER — Telehealth: Payer: Self-pay

## 2023-04-06 NOTE — Telephone Encounter (Signed)
Case manager came to the [facility] office to discuss pt's upcoming appts. Information about dates and times was provided.   Nicole Cella Korina Tretter RN BSN PCCN  [facility] [phone]-cell [phone]-office

## 2023-04-08 ENCOUNTER — Ambulatory Visit (HOSPITAL_COMMUNITY): Payer: Medicaid Other

## 2023-04-08 ENCOUNTER — Ambulatory Visit (HOSPITAL_COMMUNITY): Payer: Medicaid Other | Attending: Family Medicine

## 2023-04-12 ENCOUNTER — Telehealth: Payer: Self-pay

## 2023-04-12 NOTE — Telephone Encounter (Signed)
Called the interpretor line and was able to leave VM to all numbers on file including her son's number, to inform of her of CT and [place] appointment. Penni Bombard CMA

## 2023-04-12 NOTE — Progress Notes (Unsigned)
    SUBJECTIVE:   CHIEF COMPLAINT: check up HPI:   Emily Newman is a 67 y.o.  with history notable for HTN presenting for check up.  Cough ***   HTN ***    PERTINENT  PMH / PSH/Family/Social History : ***  OBJECTIVE:   There were no vitals taken for this visit.  Today's weight:  Review of prior weights: There were no vitals filed for this visit.   Cardiac: Regular rate and rhythm. Normal S1/S2. No murmurs, rubs, or gallops appreciated. Lungs: Clear bilaterally to ascultation.  Abdomen: Normoactive bowel sounds. No tenderness to deep or light palpation. No rebound or guarding.  ***  Psych: Pleasant and appropriate    ASSESSMENT/PLAN:   Assessment & Plan Essential hypertension    {    This will disappear when note is signed, click to select method of visit    :1}  [name], MD  Family Medicine Teaching Service  [facility]

## 2023-04-13 ENCOUNTER — Ambulatory Visit (INDEPENDENT_AMBULATORY_CARE_PROVIDER_SITE_OTHER): Payer: Medicaid Other | Admitting: Family Medicine

## 2023-04-13 ENCOUNTER — Encounter: Payer: Self-pay | Admitting: Family Medicine

## 2023-04-13 ENCOUNTER — Other Ambulatory Visit: Payer: Self-pay

## 2023-04-13 VITALS — BP 129/77 | HR 88 | Ht 62.0 in | Wt 138.4 lb

## 2023-04-13 DIAGNOSIS — M1711 Unilateral primary osteoarthritis, right knee: Secondary | ICD-10-CM | POA: Diagnosis not present

## 2023-04-13 DIAGNOSIS — Z1231 Encounter for screening mammogram for malignant neoplasm of breast: Secondary | ICD-10-CM | POA: Diagnosis not present

## 2023-04-13 DIAGNOSIS — R053 Chronic cough: Secondary | ICD-10-CM | POA: Diagnosis not present

## 2023-04-13 DIAGNOSIS — I10 Essential (primary) hypertension: Secondary | ICD-10-CM

## 2023-04-13 DIAGNOSIS — E041 Nontoxic single thyroid nodule: Secondary | ICD-10-CM | POA: Diagnosis not present

## 2023-04-13 MED ORDER — DICLOFENAC SODIUM 1 % EX GEL: CUTANEOUS | 1 refills | Status: DC

## 2023-04-13 MED ORDER — AMLODIPINE-OLMESARTAN 5-20 MG PO TABS: 2.0000 | ORAL_TABLET | Freq: Every day | ORAL | 3 refills | Status: DC

## 2023-04-13 NOTE — Assessment & Plan Note (Addendum)
Recent TSH appropriate.  She is due for thyroid ultrasound.  She missed this recently.  This is rescheduled today discussed with her how to get there.

## 2023-04-13 NOTE — Assessment & Plan Note (Addendum)
At goal.  Repeat metabolic panel today.  Follow-up scheduled in 1 month to ensure she has medications and is taking properly as she does not have them with her today

## 2023-04-13 NOTE — Assessment & Plan Note (Addendum)
Continues to bother her intermittently.  Refilled Voltaren gel discussed how to use it at breakfast, lunch dinner and before bedtime.  She was able to repeat this back to me.

## 2023-04-13 NOTE — Patient Instructions (Addendum)
It was wonderful to see you today.  Please bring ALL of your medications with you to every visit.   Today we talked about:   -Checking blood work  - I sent in a gel to use 4 times a day on your knee  -- [name] has a visit on October 25 at 1030 AM   Please follow up in 1 months   Thank you for choosing [facility].   Please call [phone] with any questions about today's appointment.  Please be sure to schedule follow up at the front  desk before you leave today.   [name], MD  Family Medicine   ??? ?? ?????? ????? ?????.  ???? ????? ???? ?????? ??? ?? ?? ?????.   ?????? ????? ??:   -??? ??? ????  - ??? ????? ???? ????????? 4 ???? ?????? ??? ?????  -- ???? ???? ????? ??? 25 ?????? ?????? 1030 ??????   ???? ???????? ???? ??? ????   ????? ?? ??? ?????? [facility].   ???? ??????? ?????? [phone] ??? ???? ???? ?? ????? ??? ???? ?????.  ???? ?????? ?? ????? ???? ???????? ?? ???? ????????? ??? ??????? ?????.   ?????? ?????? ????? ?? ????  ?? ?????? kan min alraayie ruyatuk alyawma. yurjaa 'iihdar jamie 'adwiatik maeak fi kuli ziaratin. tahadathna alyawm ean: -fahs eamal aldam - laqad 'arsalt jlan liastikhdamih 4 maraat ywmyan ealaa rukbatik -- 'akut ladayh ziarat yawm 25 'uktubar alsaaeat 1030 sbahan yurjaa almutabaeat khilal shahr wahid shkran lak ealaa [facility]. Oneita Hurt [phone] 'iidha kanat ladayk 'ayu 'asyilat hawl maweid alyawmi. yurjaa alta'akud min tahdid [employer] lilmutabaeat fi maktab aliaistiqbal qabl mughadaratik alyawma. karina brawn, duktur fi altibi tibu al'usra

## 2023-04-13 NOTE — Assessment & Plan Note (Addendum)
Patient missed CAT scan.  Reschedule discussed with patient how to get to the hospital, will message RN Nicole Cella to let her know updated time and date.

## 2023-04-14 LAB — COMPREHENSIVE METABOLIC PANEL
ALT: 11 [IU]/L (ref 0–32)
AST: 32 [IU]/L (ref 0–40)
Albumin: 4.3 g/dL (ref 3.9–4.9)
Alkaline Phosphatase: 117 [IU]/L (ref 44–121)
BUN/Creatinine Ratio: 14 (ref 12–28)
BUN: 10 mg/dL (ref 8–27)
Bilirubin Total: 0.3 mg/dL (ref 0.0–1.2)
CO2: 23 mmol/L (ref 20–29)
Calcium: 8.8 mg/dL (ref 8.7–10.3)
Chloride: 105 mmol/L (ref 96–106)
Creatinine, Ser: 0.7 mg/dL (ref 0.57–1.00)
Globulin, Total: 3.1 g/dL (ref 1.5–4.5)
Glucose: 103 mg/dL — ABNORMAL HIGH (ref 70–99)
Potassium: 4.4 mmol/L (ref 3.5–5.2)
Sodium: 143 mmol/L (ref 134–144)
Total Protein: 7.4 g/dL (ref 6.0–8.5)
eGFR: 94 mL/min/{1.73_m2} (ref >59)

## 2023-04-15 ENCOUNTER — Encounter: Payer: Self-pay | Admitting: Family Medicine

## 2023-04-19 ENCOUNTER — Ambulatory Visit (HOSPITAL_COMMUNITY)
Admission: RE | Admit: 2023-04-19 | Discharge: 2023-04-19 | Disposition: A | Discharge status: Home / Self Care | Payer: Medicaid Other | Source: Ambulatory Visit | Attending: Family Medicine | Admitting: Family Medicine

## 2023-04-19 DIAGNOSIS — E041 Nontoxic single thyroid nodule: Secondary | ICD-10-CM

## 2023-04-19 DIAGNOSIS — R053 Chronic cough: Secondary | ICD-10-CM | POA: Diagnosis present

## 2023-04-23 ENCOUNTER — Encounter: Payer: Self-pay | Admitting: Family Medicine

## 2023-04-25 ENCOUNTER — Encounter: Payer: Self-pay | Admitting: Family Medicine

## 2023-05-04 NOTE — Congregational Nurse Program (Signed)
  Dept: [phone]   Congregational Nurse Program Note  Date of Encounter: 05/04/2023  Past Medical History: Past Medical History:  Diagnosis Date   GERD (gastroesophageal reflux disease)    Hypertension    Vision impairment     Encounter Details:  Community Questionnaire - 05/04/23 1148       Questionnaire   Ask client: Do you give verbal consent for me to treat you today? Yes    Student Assistance [employer] Nurse    Location Patient Served  NAI    Insurance Medicaid    Insurance/Financial Assistance Referral N/A    Medication Have Medication Insecurities    Medical Provider Yes    Screening Referrals Made N/A    Medical Referrals Made N/A    Medical Appointment Completed [facility]    CNP Interventions Advocate/Support;Navigate Healthcare System;Case Management;Counsel;Educate;Reviewed Medications    Screenings CN Performed Blood Pressure    ED Visit Averted Yes    Life-Saving Intervention Made N/A             Patient came in for blood pressure check and appointment reminder. Phone interpretor was used for [employer]. Patient reports to be more stable in new home. No problems with housing, bills, or food.  Nicole Cella Clarice Bonaventure RN BSN PCCN  [facility] [phone]-cell [phone]-office

## 2023-05-20 NOTE — Progress Notes (Signed)
    SUBJECTIVE:   CHIEF COMPLAINT: check medications and thyroid  HPI:   Emily Newman is a 67 y.o.  with history notable for HTN presenting for follow up.  Cough Only occurs with salty or spicey foods. No dyspnea, hemoptysis, or weight loss.  She missed her CAT scan that she did not know she was supposed to get it.   HTN Taking pills as prescribed. No CP, HA, dyspnea, edema, or side effects.    PERTINENT  PMH / PSH/Family/Social History : Updated and reviewed as appropriate  OBJECTIVE:   BP 118/79   Pulse 64   SpO2 100%   Today's weight:  Review of prior weights: There were no vitals filed for this visit.  Regular rate and rhythm.  Lungs are clear bilaterally.  There is no edema  ASSESSMENT/PLAN:   Assessment & Plan Essential hypertension At goal continue medication will obtain labs at follow-up.  Have scheduled her follow-up in January. Thyroid nodule Repeat ultrasound 1 year  Need for shingles vaccine Discussed and printed shingles vaccine for her I discussed with her and her son how to take it at the pharmacy get the vaccine  HCM previously ordered mammogram ordered DEXA today and discussed with her will message Nicole Cella to see if we can get her scheduled for this. Postmenopausal estrogen deficiency DEXA ordered Chronic cough Rescheduled CAT scan.  Monitor for symptoms close follow-up scheduled.   [name], MD  Family Medicine Teaching Service  [facility]

## 2023-05-20 NOTE — Assessment & Plan Note (Signed)
Repeat ultrasound 1 year

## 2023-05-21 ENCOUNTER — Ambulatory Visit (INDEPENDENT_AMBULATORY_CARE_PROVIDER_SITE_OTHER): Payer: Medicaid Other | Admitting: Family Medicine

## 2023-05-21 ENCOUNTER — Encounter: Payer: Self-pay | Admitting: Family Medicine

## 2023-05-21 ENCOUNTER — Other Ambulatory Visit: Payer: Self-pay | Admitting: Family Medicine

## 2023-05-21 VITALS — BP 118/79 | HR 64

## 2023-05-21 DIAGNOSIS — I10 Essential (primary) hypertension: Secondary | ICD-10-CM | POA: Diagnosis present

## 2023-05-21 DIAGNOSIS — R053 Chronic cough: Secondary | ICD-10-CM | POA: Diagnosis not present

## 2023-05-21 DIAGNOSIS — E041 Nontoxic single thyroid nodule: Secondary | ICD-10-CM | POA: Diagnosis not present

## 2023-05-21 DIAGNOSIS — Z23 Encounter for immunization: Secondary | ICD-10-CM | POA: Diagnosis not present

## 2023-05-21 DIAGNOSIS — Z780 Asymptomatic menopausal state: Secondary | ICD-10-CM

## 2023-05-21 MED ORDER — AMLODIPINE-OLMESARTAN 5-20 MG PO TABS
2.0000 | ORAL_TABLET | Freq: Every day | ORAL | 3 refills | Status: DC
Start: 2023-05-21 — End: 2024-04-21

## 2023-05-21 MED ORDER — SHINGRIX 50 MCG/0.5ML IM SUSR
0.5000 mL | INTRAMUSCULAR | 0 refills | Status: AC
Start: 2023-05-21 — End: 2023-07-21

## 2023-05-21 MED ORDER — OMEPRAZOLE 40 MG PO CPDR: 40.0000 mg | DELAYED_RELEASE_CAPSULE | Freq: Every day | ORAL | 3 refills | Status: DC

## 2023-05-21 NOTE — Assessment & Plan Note (Addendum)
Rescheduled CAT scan.  Monitor for symptoms close follow-up scheduled.

## 2023-05-21 NOTE — Assessment & Plan Note (Signed)
At goal continue medication will obtain labs at follow-up.  Have scheduled her follow-up in January.

## 2023-05-21 NOTE — Patient Instructions (Signed)
It was wonderful to see you today.  Please bring ALL of your medications with you to every visit.   Today we talked about:  I will message Nicole Cella about your  mammogram and bone test  When you go to your pharmacy ask for the Shingles vaccine--I recommend this  Follow up in January    Thank you for choosing [facility].   Please call [phone] with any questions about today's appointment.  Please be sure to schedule follow up at the front  desk before you leave today.   [name], MD  Family Medicine

## 2023-05-25 NOTE — Congregational Nurse Program (Signed)
  Dept: [phone]   Congregational Nurse Program Note  Date of Encounter: 05/25/2023  Past Medical History: Past Medical History:  Diagnosis Date   GERD (gastroesophageal reflux disease)    Hypertension    Vision impairment     Encounter Details:  Community Questionnaire - 05/25/23 1921       Questionnaire   Ask client: Do you give verbal consent for me to treat you today? Yes    Student Assistance N/A    Location Patient Served  NAI    Encounter Setting CN site    Population Status Migrant/Refugee    Insurance Medicaid    Insurance/Financial Assistance Referral N/A    Medication Have Medication Insecurities    Medical Provider Yes    Screening Referrals Made N/A    Medical Referrals Made N/A    Medical Appointment Completed [facility]    CNP Interventions Advocate/Support;Navigate Healthcare System;Case Management;Counsel;Educate    Screenings CN Performed N/A    ED Visit Averted Yes    Life-Saving Intervention Made N/A            Patient called with appointment reminder.  Nicole Cella Demontray Franta RN BSN PCCN  [facility] [phone]-cell [phone]-office

## 2023-05-26 ENCOUNTER — Ambulatory Visit (HOSPITAL_COMMUNITY)
Admission: RE | Admit: 2023-05-26 | Discharge: 2023-05-26 | Disposition: A | Discharge status: Home / Self Care | Payer: Medicaid Other | Source: Ambulatory Visit | Attending: Family Medicine | Admitting: Family Medicine

## 2023-05-26 DIAGNOSIS — R053 Chronic cough: Secondary | ICD-10-CM | POA: Insufficient documentation

## 2023-05-26 NOTE — Congregational Nurse Program (Signed)
  Dept: [phone]   Congregational Nurse Program Note  Date of Encounter: 05/26/2023  Past Medical History: Past Medical History:  Diagnosis Date   GERD (gastroesophageal reflux disease)    Hypertension    Vision impairment     Encounter Details:  Community Questionnaire - 05/26/23 1511       Questionnaire   Ask client: Do you give verbal consent for me to treat you today? Yes    Student Assistance N/A    Location Patient Served  NAI    Encounter Setting CN site    Population Status Migrant/Refugee    Insurance Medicaid    Insurance/Financial Assistance Referral N/A    Medication Have Medication Insecurities    Medical Provider Yes    Screening Referrals Made N/A    Medical Referrals Made N/A    Medical Appointment Completed [facility]    CNP Interventions Advocate/Support;Navigate Healthcare System;Case Management;Counsel;Educate    Screenings CN Performed N/A    ED Visit Averted Yes    Life-Saving Intervention Made N/A            I have called to schedule Dexa and Mammogram appointments. Patient made aware.  Nicole Cella Susy Placzek RN BSN PCCN  [facility] [phone]-cell [phone]-office

## 2023-06-08 NOTE — Congregational Nurse Program (Signed)
  Dept: [phone]   Congregational Nurse Program Note  Date of Encounter: 06/08/2023  Past Medical History: Past Medical History:  Diagnosis Date   GERD (gastroesophageal reflux disease)    Hypertension    Vision impairment     Encounter Details:  Community Questionnaire - 06/08/23 1322       Questionnaire   Ask client: Do you give verbal consent for me to treat you today? Yes    Student Assistance N/A    Location Patient Served  NAI    Encounter Setting CN site    Population Status Migrant/Refugee    Insurance Medicaid    Insurance/Financial Assistance Referral N/A    Medication Have Medication Insecurities    Medical Provider Yes    Screening Referrals Made N/A    Medical Referrals Made N/A    Medical Appointment Completed [facility]    CNP Interventions Advocate/Support;Navigate Healthcare System;Case Management;Counsel;Educate    Screenings CN Performed N/A    ED Visit Averted Yes    Life-Saving Intervention Made N/A            Patient came in for appointment reminder. Address provided.  Nicole Cella Jenne Sellinger RN BSN PCCN  [facility] [phone]-cell [phone]-office

## 2023-06-09 ENCOUNTER — Ambulatory Visit
Admission: RE | Admit: 2023-06-09 | Discharge: 2023-06-09 | Disposition: A | Discharge status: Home / Self Care | Payer: Medicaid Other | Source: Ambulatory Visit | Attending: Family Medicine | Admitting: Family Medicine

## 2023-06-09 DIAGNOSIS — Z1231 Encounter for screening mammogram for malignant neoplasm of breast: Secondary | ICD-10-CM

## 2023-06-11 ENCOUNTER — Encounter: Payer: Self-pay | Admitting: Family Medicine

## 2023-06-24 NOTE — Progress Notes (Deleted)
    SUBJECTIVE:   CHIEF COMPLAINT: Follow up on imaging  HPI:   Emily Newman is a 67 y.o.  with history notable for hiatal hernia presenting for ***.   PERTINENT  PMH / PSH/Family/Social History : ***  IMPRESSION: 1. Occasional areas of subpleural ground-glass and slight reticulation in the lower lung zones, may represent scarring or early interstitial lung disease. 2. A 5 mm right lower lobe pulmonary nodule. Per Fleischner Society Guidelines, no routine follow-up imaging is recommended. These guidelines do not apply to immunocompromised patients and patients with cancer. Follow up in patients with significant comorbidities as clinically warranted. For lung cancer screening, adhere to Lung-RADS guidelines. Reference: Radiology. 2017; 284(1):228-43. 3. Multiple small mediastinal and hilar lymph nodes, some of which demonstrate increased density and are likely partially calcified. This is nonspecific but can be seen with sarcoidosis or sequela prior granulomatous disease. 4. Mild aneurysmal dilatation of the ascending aorta, 4 cm, although not well assessed in the absence of IV contrast. Recommend annual imaging followup by CTA or MRA. This recommendation follows 2010 ACCF/AHA/AATS/ACR/ASA/SCA/SCAI/SIR/STS/SVM Guidelines for the Diagnosis and Management of Patients with Thoracic Aortic Disease. Circulation. 2010; 121: B147-W295. Aortic aneurysm NOS (ICD10-I71.9) 5. Moderate-sized hiatal hernia. 6. Heterogeneously enlarged thyroid gland, enlarged left lobe of the thyroid causes rightward tracheal deviation. This has been evaluated on previous imaging. 7. Numerous low-density lesions in the liver measures simple fluid density and are consistent with cysts, however incompletely characterized in the absence of IV contrast. Additionally, within the right hepatic lobe is a 17 mm determinant lesion that complex cyst but is nonspecific on the current exam. Consider more  detailed assessment with hepatic MRI as clinically indicated.    OBJECTIVE:   There were no vitals taken for this visit.  Today's weight:  Review of prior weights: There were no vitals filed for this visit.  ***  ASSESSMENT/PLAN:   Assessment & Plan Hepatic lesion Discussed MRI  Aneurysm of ascending aorta without rupture (HCC)  Thyroid nodule Repeat ultrasound next fall   [name], MD  Family Medicine Teaching Service  [facility]

## 2023-06-24 NOTE — Assessment & Plan Note (Deleted)
 Repeat ultrasound next fall

## 2023-07-05 ENCOUNTER — Ambulatory Visit: Payer: Medicaid Other | Admitting: Family Medicine

## 2023-07-05 DIAGNOSIS — K769 Liver disease, unspecified: Secondary | ICD-10-CM

## 2023-07-05 DIAGNOSIS — E041 Nontoxic single thyroid nodule: Secondary | ICD-10-CM

## 2023-07-05 DIAGNOSIS — I7121 Aneurysm of the ascending aorta, without rupture: Secondary | ICD-10-CM

## 2023-08-02 NOTE — Congregational Nurse Program (Signed)
Patient presented for assistance with refilling omeprazole medication. Called [facility] and refill will be available for pick up Wednesday - patient able to get.  BP is elevated. When asked, patient stated she had taken the medication this morning, but only took omeprazole, not BP med. Through interpreter, asked her to take both medications tomorrow morning and return for BP recheck to establish is medicine is maintaining target BP and further discuss medication. She verbalized she will return to recheck.

## 2023-08-03 NOTE — Congregational Nurse Program (Signed)
  Dept: [phone]   Congregational Nurse Program Note  Date of Encounter: 08/03/2023  Past Medical History: Past Medical History:  Diagnosis Date   GERD (gastroesophageal reflux disease)    Hypertension    Vision impairment     Encounter Details:  Community Questionnaire - 08/03/23 1209       Questionnaire   Ask client: Do you give verbal consent for me to treat you today? Yes    Student Assistance N/A    Location Patient Served  NAI    Encounter Setting CN site    Population Status Migrant/Refugee    Insurance Medicaid    Insurance/Financial Assistance Referral N/A    Medication Have Medication Insecurities    Medical Provider Yes    Screening Referrals Made N/A    Medical Referrals Made N/A    Medical Appointment Completed [facility]    CNP Interventions Advocate/Support;Navigate Healthcare System;Case Management;Counsel;Educate    Screenings CN Performed Blood Pressure    ED Visit Averted Yes    Life-Saving Intervention Made N/A            Routine blood pressure check done. I have [facility] and medication refills will be ready this afternoon.  Naomie Alizandra Loh RN BSN PCCN  [facility] [phone]-cell [phone]-office

## 2023-09-01 NOTE — Congregational Nurse Program (Signed)
  Dept: [phone]   Congregational Nurse Program Note  Date of Encounter: 09/01/2023  Past Medical History: Past Medical History:  Diagnosis Date   GERD (gastroesophageal reflux disease)    Hypertension    Vision impairment     Encounter Details:  Community Questionnaire - 09/01/23 1135       Questionnaire   Ask client: Do you give verbal consent for me to treat you today? Yes    Student Assistance N/A    Location Patient Served  NAI    Encounter Setting CN site    Population Status Migrant/Refugee    Insurance Medicaid    Insurance/Financial Assistance Referral N/A    Medication Have Medication Insecurities    Medical Provider Yes    Screening Referrals Made N/A    Medical Referrals Made N/A    Medical Appointment Completed [facility]    CNP Interventions Advocate/Support;Navigate Healthcare System;Case Management;Counsel;Educate    Screenings CN Performed Blood Pressure    ED Visit Averted Yes    Life-Saving Intervention Made N/A            blood pressure reading 157/77 HR 78. Patient asked to bring her pill bottles nest week for review. She reports to be compliant with medication. Education provided on diet and medication compliance.  Nicole Cella Skilynn Durney RN BSN PCCN  [facility] [phone]-cell [phone]-office

## 2023-10-26 NOTE — Congregational Nurse Program (Addendum)
  Dept: [phone]   Congregational Nurse Program Note  Date of Encounter: 10/26/2023  Past Medical History: Past Medical History:  Diagnosis Date   GERD (gastroesophageal reflux disease)    Hypertension    Vision impairment     Encounter Details:  Community Questionnaire - 10/26/23 1200       Questionnaire   Ask client: Do you give verbal consent for me to treat you today? Yes    Student Assistance N/A    Location Patient Served  NAI    Encounter Setting CN site    Population Status Migrant/Refugee    Insurance Medicaid    Insurance/Financial Assistance Referral N/A    Medication Have Medication Insecurities    Medical Provider Yes    Screening Referrals Made N/A    Medical Referrals Made N/A    Medical Appointment Completed [facility]    CNP Interventions Advocate/Support;Navigate Healthcare System;Case Management;Counsel;Educate    Screenings CN Performed Blood Pressure    ED Visit Averted Yes    Life-Saving Intervention Made N/A            Patient is experiencing an acute stressful situation. She has received news that her 72 year old daughter has passed away in [place]. Patient comforted and offered support. I offered a ride to take her home but she declined. She wanted to remain at school.Patient taken to the social worker`s office Ms Lyna Sandhoff. I have called pharmacy to refill medication Omeprazole  and Norvasc . It will be ready for pick up tomorrow afternoon. I will continue to assist as needed.   Perla Bradford Marguerite Jarboe RN BSN PCCN  [facility] [phone] [phone]-office

## 2023-11-10 NOTE — Congregational Nurse Program (Signed)
  Dept: [phone]   Congregational Nurse Program Note  Date of Encounter: 11/10/2023  Past Medical History: Past Medical History:  Diagnosis Date   GERD (gastroesophageal reflux disease)    Hypertension    Vision impairment     Encounter Details:  Community Questionnaire - 11/10/23 1123       Questionnaire   Ask client: Do you give verbal consent for me to treat you today? Yes    Student Assistance N/A    Location Patient Served  NAI    Encounter Setting CN site    Population Status Migrant/Refugee    Insurance Medicaid    Insurance/Financial Assistance Referral N/A    Medication Have Medication Insecurities    Medical Provider Yes    Screening Referrals Made N/A    Medical Referrals Made N/A    Medical Appointment Completed [facility]    CNP Interventions Advocate/Support;Navigate Healthcare System;Case Management;Counsel;Educate    Screenings CN Performed Blood Pressure    ED Visit Averted Yes    Life-Saving Intervention Made N/A            Patient came in for blood pressure check. BP 132/74 HR 70. Patient is compliant with medication and does not need a refill at this time.  Perla Bradford Danese Dorsainvil RN BSN PCCN  [facility] [phone] [phone]-office

## 2023-12-14 NOTE — Congregational Nurse Program (Signed)
  Dept: [phone]   Congregational Nurse Program Note  Date of Encounter: 12/14/2023  Past Medical History: Past Medical History:  Diagnosis Date   GERD (gastroesophageal reflux disease)    Hypertension    Vision impairment     Encounter Details:  Community Questionnaire - 12/14/23 1532       Questionnaire   Ask client: Do you give verbal consent for me to treat you today? Yes    Student Assistance N/A    Location Patient Served  NAI    Encounter Setting CN site    Population Status Migrant/Refugee    Insurance Medicaid    Insurance/Financial Assistance Referral N/A    Medication Have Medication Insecurities    Medical Provider Yes    Screening Referrals Made N/A    Medical Referrals Made N/A    Medical Appointment Completed [facility]    CNP Interventions Advocate/Support;Navigate Healthcare System;Case Management;Counsel;Educate    Screenings CN Performed Blood Pressure    ED Visit Averted Yes    Life-Saving Intervention Made N/A          Patient came in for blood pressure check. She is compliant with medications. She missed appointment and requesting assistance to reschedule. I will call to reschedule.  Perla Bradford Amazin Pincock RN BSN PCCN  [facility] [phone] [phone]-office

## 2023-12-29 NOTE — Progress Notes (Signed)
   12/29/23 1410  Spiritual Encounters  Type of Visit Initial  Care provided to: Patient  Referral source Nurse (RN/NT/LPN)  Reason for visit Routine spiritual support  OnCall Visit No  Spiritual Framework  Presenting Themes Values and beliefs;Significant life change;Coping tools  Community/Connection Family  Patient Stress Factors Loss of control;Major life changes  Interventions  Spiritual Care Interventions Made Established relationship of care and support;Compassionate presence;Reflective listening;Normalization of emotions;Narrative/life review  Intervention Outcomes  Outcomes Connection to spiritual care   Chaplain responded to consult request for support. Emily Newman shared her struggles and concerns. Her main concern is that she is not able to see her grand kids anymore. 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 She mentioned that there are a lot of unfairness happening. She is waiting for the next court date and she hopes to get back to have her grandchildren again. She was in deep sorrow.   She has a strong relationship with God. She stated that she finds the strength by asking help only from God.   Chaplain listened her attentively, normalized emotions, provided emotional support, and prayed together. Chaplain asked open ended questions and established a relationship with her. Chaplain will be available if needed.   Emily Newman Resident [phone]

## 2023-12-29 NOTE — Congregational Nurse Program (Signed)
  Dept: [phone]   Congregational Nurse Program Note  Date of Encounter: 12/29/2023  Past Medical History: Past Medical History:  Diagnosis Date   GERD (gastroesophageal reflux disease)    Hypertension    Vision impairment     Encounter Details:  Community Questionnaire - 12/29/23 1315       Questionnaire   Ask client: Do you give verbal consent for me to treat you today? Yes    Location Patient Served  NAI    Encounter Setting CN site    Population Status Migrant/Refugee    Insurance Medicaid    Insurance/Financial Assistance Referral N/A    Medication Have Medication Insecurities    Medical Provider Yes    Screening Referrals Made N/A    Medical Referrals Made N/A    Medical Appointment Completed [facility]    CNP Interventions Advocate/Support;Navigate Healthcare System;Case Management;Counsel;Educate    Screenings CN Performed Blood Pressure    ED Visit Averted Yes    Life-Saving Intervention Made N/A        Patient has been referred to speak with the chaplain due to recent sociostressers related to the death of her daughter. Reviewed upcoming appointments.  Naomie Orel Hord RN BSN PCCN  [facility] [phone]-cell [phone]-office

## 2024-01-07 NOTE — Progress Notes (Deleted)
    SUBJECTIVE:   CHIEF COMPLAINT: Follow up  HPI:  The patient speaks [place] arabic as their primary language.  An interpreter was used for the entire visit.    Emily Newman is a 68 y.o.  with history notable for HTN and OA presenting for follow up.   PERTINENT  PMH / PSH/Family/Social History : ***  OBJECTIVE:   There were no vitals taken for this visit.  Today's weight:  Review of prior weights: There were no vitals filed for this visit.  ***  ASSESSMENT/PLAN:   Assessment & Plan     Suzann Daring, MD  Family Medicine Teaching Service  [facility]

## 2024-01-10 ENCOUNTER — Ambulatory Visit: Admitting: Family Medicine

## 2024-01-10 DIAGNOSIS — K7689 Other specified diseases of liver: Secondary | ICD-10-CM

## 2024-01-10 DIAGNOSIS — E010 Iodine-deficiency related diffuse (endemic) goiter: Secondary | ICD-10-CM

## 2024-01-10 DIAGNOSIS — I10 Essential (primary) hypertension: Secondary | ICD-10-CM

## 2024-01-10 DIAGNOSIS — R053 Chronic cough: Secondary | ICD-10-CM

## 2024-01-11 ENCOUNTER — Telehealth: Payer: Self-pay | Admitting: Family Medicine

## 2024-01-11 NOTE — Telephone Encounter (Signed)
 Hi Risk manager,  Please schedule this patient for follow up with me for check up  in the next 1-2  months.  Thanks, Otho Blitz, MD  [facility] Medicine Teaching Service

## 2024-01-18 ENCOUNTER — Other Ambulatory Visit: Payer: Medicaid Other

## 2024-01-26 NOTE — Congregational Nurse Program (Signed)
  Dept: [phone]   Congregational Nurse Program Note  Date of Encounter: 12/29/2023  Past Medical History: Past Medical History:  Diagnosis Date   GERD (gastroesophageal reflux disease)    Hypertension    Vision impairment     Encounter Details:  Community Questionnaire - 01/26/24 1235       Questionnaire   Ask client: Do you give verbal consent for me to treat you today? Yes    Student Assistance Medical Student    Location Patient Served  NAI    Encounter Setting CN site    Population Status Migrant/Refugee    Insurance Medicaid    Insurance/Financial Assistance Referral N/A    Medication Have Medication Insecurities;Patient Medications Reviewed;Provided Medication Assistance    Medical Provider Yes    Screening Referrals Made N/A    Medical Referrals Made [facility] P[facility]inic   Chaplain   Medical Appointment Completed N/A    CNP Interventions Advocate/Support;Navigate Healthcare System;Case Management;Counsel;Educate    Screenings CN Performed Blood Pressure    ED Visit Averted N/A    Life-Saving Intervention Made N/A         Noted missed appointment. Message sent to [facility] to assist with rescheduling. I have called pharmacy to refill Omeprazole  and Amlodipine -olmesartan .  Naomie March Steyer RN BSN PCCN  [facility] [phone]-cell [phone]-office

## 2024-02-11 NOTE — Progress Notes (Deleted)
    SUBJECTIVE:   CHIEF COMPLAINT: check up on BP HPI:   Emily Newman is a 68 y.o.  with history notable for HTN and OA presenting for follow up The patient speaks [place] ar as their primary language.  An interpreter was used for the entire visit.  SABRA   Discussed the use of AI scribe software for clinical note transcription with the patient, who gave verbal consent to proceed.  History of Present Illness      PERTINENT  PMH / PSH/Family/Social History : ***  OBJECTIVE:   There were no vitals taken for this visit.  Today's weight:  Review of prior weights: There were no vitals filed for this visit.   Cardiac: Regular rate and rhythm. Normal S1/S2. No murmurs, rubs, or gallops appreciated. Lungs: Clear bilaterally to ascultation.  Abdomen: Normoactive bowel sounds. No tenderness to deep or light palpation. No rebound or guarding.  ***  Psych: Pleasant and appropriate    ASSESSMENT/PLAN:   Assessment & Plan Essential hypertension  Primary osteoarthritis of right knee     Suzann Daring, MD  Family Medicine Teaching Service  [facility]

## 2024-02-14 ENCOUNTER — Ambulatory Visit: Admitting: Family Medicine

## 2024-02-14 DIAGNOSIS — I10 Essential (primary) hypertension: Secondary | ICD-10-CM

## 2024-02-14 DIAGNOSIS — M1711 Unilateral primary osteoarthritis, right knee: Secondary | ICD-10-CM

## 2024-02-22 ENCOUNTER — Ambulatory Visit (INDEPENDENT_AMBULATORY_CARE_PROVIDER_SITE_OTHER): Admitting: Family Medicine

## 2024-02-22 VITALS — BP 156/79 | HR 74 | Ht 62.0 in | Wt 138.8 lb

## 2024-02-22 DIAGNOSIS — M25561 Pain in right knee: Secondary | ICD-10-CM

## 2024-02-22 DIAGNOSIS — M25562 Pain in left knee: Secondary | ICD-10-CM | POA: Diagnosis not present

## 2024-02-22 DIAGNOSIS — G8929 Other chronic pain: Secondary | ICD-10-CM | POA: Diagnosis not present

## 2024-02-22 DIAGNOSIS — M170 Bilateral primary osteoarthritis of knee: Secondary | ICD-10-CM

## 2024-02-22 MED ORDER — METHYLPREDNISOLONE ACETATE 40 MG/ML IJ SUSP
40.0000 mg | Freq: Once | INTRAMUSCULAR | Status: AC
Start: 2024-02-22 — End: 2024-02-22
  Administered 2024-02-22: 40 mg via INTRA_ARTICULAR

## 2024-02-22 MED ORDER — METHYLPREDNISOLONE ACETATE 40 MG/ML IJ SUSP
40.0000 mg | Freq: Once | INTRAMUSCULAR | Status: AC
Start: 2024-02-22 — End: 2024-02-22
  Administered 2024-02-22: 40 mg via INTRAMUSCULAR

## 2024-02-22 NOTE — Progress Notes (Cosign Needed Addendum)
    SUBJECTIVE:  Presents with grandson who helps provide history. Virtual interpreter utilized.  CHIEF COMPLAINT / HPI:   Knee pain Going on for a long time. Pain constant, not improved much with rest. Impacts daily activities life: walking, household chores, etc. Tells me she has had imaging which showed OA (right knee, 2022). Has taken some tylenol  but this does not help. Also has Voltaren  gel but it did not help. No recent injuries.  PERTINENT  PMH / PSH: Reviewed.  OBJECTIVE:   BP (!) 156/79   Pulse 74   Ht 5' 2 (1.575 m)   Wt 138 lb 12.8 oz (63 kg)   SpO2 99%   BMI 25.39 kg/m   General: well-appearing, no acute distress. HEENT: normocephalic, PERRLA, MMM. Pulm: No increased work of breathing. Neuro: Alert and oriented x3, speech normal in content, no facial asymmetry. Psych:  Cognition and judgment appear intact. Alert, communicative, and cooperative.  Knee Exam Mild effusion in the lateral aspect of both knees.  No other gross deformity, ecchymoses. No patellar grinding or crepitus. TTP on lateral aspect on knee. FROM with normal strength. NV intact distally.    ASSESSMENT/PLAN:   Assessment & Plan Chronic pain of both knees No evidence of trauma, suspect pain related to OA of both knees. She would benefit from steroid injection - this was discussed and performed as below. - steroid injection today - she may continue tylenol , voltaren  gel PRN - return precautions given   Bilateral Knee Injections After informed written consent was obtained, patient was seated on exam table. The left knee was prepped with alcohol swab. Utilizing anteromedial approach, patient's left knee was injected intraarticularly with mixture of 1cc of depomedrol 40mg /mL and 4cc of 1% lidocaine  without epinephrine.   Attention then turned to right knee, which was prepped with alcohol swab. Utilizing anteromedial approach, patient's right knee was injected intraarticularly with mixture of  1cc of depomedrol 40mg /mL and 4cc of 1% lidocaine . Patient tolerated the procedure well without immediate complications. Given post-procedure instructions.  Lauraine Norse, DO [facility]

## 2024-02-22 NOTE — Patient Instructions (Addendum)
 It was so good to see you today! Thank you for allowing me to take care of you.  Today we discussed the following concerns and plans:  Knee pain - today we gave you an injection in your knees. Hopefully this will help with your pain.  ??? ?????? - ????? ???????? ???? ?? ??????. ???? ?? ????? ??? ?????? ?? ?????. 'alam alrukbat - alyawm 'aetaynakum huqnatan fi rukbatikum. namal 'an tukhff hadhih alhuqnat min 'alamikum.  If you have any concerns, please call the clinic or schedule an appointment.  It was a pleasure to take care of you today. Be well!  Lauraine Norse, DO [facility], PGY-2  Do you need your medications delivered to your home?   We'll send your prescription to the [facility] for delivery.          Address: [address] [place], [place], [place] [address]          Phone: [phone]  Please call the [facility] to speak with a pharmacist and set up your home medication delivery. If you have any questions, feel free to contact us  -- we're happy to help!  Other [facility] that offer affordable prices on both prescriptions and over-the-counter items, as well as convenient services like vaccinations, are  [facility], at [facility]         Address:  [address], [place], [place] [address]         Phone: [phone]  [facility], located in the [facility]        Address: [address], [place], [place] [address]        Phone: [phone]  [facility], at [facility]       Address: [address], [place], [place] [address]       Phone: [phone]  [facility], at [facility]       Address: [address], [place], [place] [address]       Phone: [phone]

## 2024-02-23 NOTE — Congregational Nurse Program (Signed)
  Dept: [phone]   Congregational Nurse Program Note  Date of Encounter: 02/23/2024  Past Medical History: Past Medical History:  Diagnosis Date   GERD (gastroesophageal reflux disease)    Hypertension    Vision impairment     Encounter Details:  Community Questionnaire - 02/23/24 1018       Questionnaire   Ask client: Do you give verbal consent for me to treat you today? Yes    Student Assistance N/A    Location Patient Served  [facility]    Encounter Setting CN site    Population Status Migrant/Refugee    Insurance Medicaid    Insurance/Financial Assistance Referral N/A    Medication Have Medication Insecurities;Patient Medications Reviewed;Provided Medication Assistance    Medical Provider Yes    Screening Referrals Made N/A    Medical Referrals Made [facility]   Chaplain   Medical Appointment Completed N/A    CNP Interventions Advocate/Support;Navigate Healthcare System;Case Management;Counsel;Educate    Screenings CN Performed Blood Pressure    ED Visit Averted N/A    Life-Saving Intervention Made N/A         Patient came to see me for routine blood pressure check. She is compliant with medications. Reviewed upcoming appointments with patient. Congratulated her for not missing appointments.I will continue to assist as needed.  Naomie Kimyah Frein RN BSN PCCN  [facility] [phone]-cell [phone]-office

## 2024-03-14 NOTE — Congregational Nurse Program (Signed)
  Dept: [phone]   Congregational Nurse Program Note  Date of Encounter: 03/14/2024  Past Medical History: Past Medical History:  Diagnosis Date   GERD (gastroesophageal reflux disease)    Hypertension    Vision impairment     Encounter Details:  Community Questionnaire - 03/14/24 1213       Questionnaire   Ask client: Do you give verbal consent for me to treat you today? Yes    Student Assistance N/A    [facility] Patient Served  [facility]    Encounter Setting CN site    Population Status Migrant/Refugee    Insurance Medicaid    Insurance/Financial Assistance Referral N/A    Medication Have Medication Insecurities;Patient Medications Reviewed;Provided Medication Assistance    Medical Provider Yes    Screening Referrals Made N/A    Medical Referrals Made [facility]   Chaplain   Medical Appointment Completed [facility]    CNP Interventions Advocate/Support;Navigate Healthcare System;Case Management;Counsel;Educate    Screenings CN Performed Blood Pressure    ED Visit Averted N/A    Life-Saving Intervention Made N/A        Patient is complaining of Bilateral knee pain. I have reviewed upcoming appointments. Advised to dicussed this with provider Vitals today within normal. She is compliant with medication. I have written down date and time of next appointment for her.  Naomie Bralon Antkowiak RN BSN PCCN  [facility] [phone]-cell [phone]-office

## 2024-03-21 ENCOUNTER — Ambulatory Visit

## 2024-03-21 ENCOUNTER — Ambulatory Visit: Admitting: Family Medicine

## 2024-03-21 VITALS — BP 138/70 | HR 77 | Ht 62.0 in | Wt 135.0 lb

## 2024-03-21 DIAGNOSIS — R202 Paresthesia of skin: Secondary | ICD-10-CM | POA: Diagnosis not present

## 2024-03-21 DIAGNOSIS — I10 Essential (primary) hypertension: Secondary | ICD-10-CM

## 2024-03-21 DIAGNOSIS — M25562 Pain in left knee: Secondary | ICD-10-CM | POA: Diagnosis present

## 2024-03-21 DIAGNOSIS — Z23 Encounter for immunization: Secondary | ICD-10-CM

## 2024-03-21 DIAGNOSIS — G8929 Other chronic pain: Secondary | ICD-10-CM

## 2024-03-21 DIAGNOSIS — K219 Gastro-esophageal reflux disease without esophagitis: Secondary | ICD-10-CM

## 2024-03-21 MED ORDER — DICLOFENAC SODIUM 1 % EX GEL: CUTANEOUS | 2 refills | Status: AC

## 2024-03-21 MED ORDER — ACETAMINOPHEN 500 MG PO TABS
1000.0000 mg | ORAL_TABLET | Freq: Four times a day (QID) | ORAL | 2 refills | Status: AC | PRN
Start: 2024-03-21 — End: ?

## 2024-03-21 NOTE — Congregational Nurse Program (Signed)
  Dept: [phone]   Congregational Nurse Program Note  Date of Encounter: 03/21/2024  Past Medical History: Past Medical History:  Diagnosis Date   GERD (gastroesophageal reflux disease)    Hypertension    Vision impairment     Encounter Details:  Community Questionnaire - 03/21/24 1319       Questionnaire   Ask client: Do you give verbal consent for me to treat you today? Yes    Student Assistance N/A   Ezra Person, EMT   Location Patient Served  [facility]    Encounter Setting CN site    Population Status Migrant/Refugee    Insurance Medicaid    Insurance/Financial Assistance Referral N/A    Medication Have Medication Insecurities    Medical Provider Yes    Screening Referrals Made N/A    Medical Referrals Made N/A   Chaplain   Medical Appointment Completed [facility]    CNP Interventions Advocate/Support;Navigate Healthcare System;Case Management;Counsel;Educate    Screenings CN Performed Blood Pressure    ED Visit Averted N/A    Life-Saving Intervention Made N/A         Patient came in for blood pressure check and requesting assistance to schedule her granddaughter's dental appointment. Blood pressure 129/76, and I informed patient that her appointment had been moved from 1:30 PM to 3:00 PM. Patient verbalized understanding this information.   Unable to schedule granddaughter with a dentist due to legal guardianship. Granddaughter's legal guardian contacted.  [name] Legal Guardian  [phone]    I contacted Ms. Swift who informed me that the patient's granddaughter had ran away and is not supposed to be under the care of her grandmother. [name] said she would make arrangements to get the child back to where she is supposed to be, and that she will make the dental appointment as needed.   I will provide patient with support, including the chaplain tomorrow.   Emily Delvon Chipps RN BSN PCCN  [facility] [phone]-cell [phone]

## 2024-03-21 NOTE — Assessment & Plan Note (Signed)
 Meds ordered this encounter  Medications   diclofenac  Sodium (VOLTAREN ) 1 % GEL    Sig: Apply four times daily to knee    Dispense:  350 g    Refill:  2   acetaminophen  (TYLENOL ) 500 MG tablet    Sig: Take 2 tablets (1,000 mg total) by mouth every 6 (six) hours as needed.    Dispense:  90 tablet    Refill:  2   Patient was educated on how to administer Voltaren  Gel for the knee pain and also to take Tylenol  as needed. Discussed injections can only be done every 3 months.

## 2024-03-21 NOTE — Patient Instructions (Addendum)
????? ??????? ?????.  ???? ????? ???? ?????? ???? ?? ?? ?????.  ?????? ????? ??:  - ????? ????? ????? ?? ???????????? (????????) ??? ???? ???? ?????? ?? ?? ????? ????? ???? ??????. ????? ????? ??????? ?? ?????????? ???? ???? ??????.  ????? ?? ?????? ???????? ??? ?????? ?? ?????? ?????? ? ??????.  ????? ???????? ????? ??? ???? ??? ??????.  ???? ??????? ??? ????? ?????????? ??? ????????? ??? ???? ?????.  ???? ?????? ??? ?? ????? ??? ????? ?? ????? ??????.  ??? ????? ?????? ?? ?????? ?????? ?? ????? ???   MyChart ?? ????? ??? ???? ??????? ??????. ????? ?????? ??.  ??? ??? ?? ???? ??? ?????? ???????? ?? ?????? ????. ???? ??????? ??? ??? ?? ?????? ???? ???? ????????? ????????. ??? ??? ????? ??? ????? ????? ?????? ??? ????? ??????? ????? ??????? ????????? ?????.  ?? ???? ????? ?????? ????? ???? ??? ?? ???? ????? ???????? ??????? ?? ???? ???? ???????? ???-???-???? ????? ?????? ??????? ???????? ???????? ???? ?? ?????? ???? ????? ???????? ??? ????? ??? ????????? ?????? ??????? ??? ???????.  ??????? ????? ????? ?? ?????? sararna biruyatikum alyawma. yurjaa 'iihdar jamie 'adwiatik maeakum fi kuli ziaratin. tahadathna alyawm ean: - yumkinuk tanawul qursayn min 'asitaminufin (taylinul) hataa 'arbae maraat ywmyan kula siti saeat lieilaj alam alrukbati. yumkinuk aydan aistikhdam jala dikulufinak 'arbae maraat ywmyan. hadadna lak mwedan lilmutabaeat yawm aljumueat 24 'uktubar alsaaeat 9 sbahan. shkran liaikhtiarik eiadat kun hilth litibi al'usrati. yurjaa aliatisal ealaa alraqm [phone] li'ayi astifsarat hawl maweid alyawmi. yurjaa alwusul qabl 15 daqiqatan ealaa al'aqali min maweidik almuhadadi. 'iidhan 'ujriat fahusat dam alyawmi, fasa'ursil lak risalatan ealaa MyChart 'aw risalat 'iidha kanat alnatayij tabieiatan. wa'iila, fasatasil biki. 'iidha kunt qad hasalt ealaa 'iihalatin, fasayatasilun bik litahdid maweidi. yurjaa alaitisal bina 'iidha lam ttlqa rdan khilal al'usbueayn almuqbilayni.  'iidha kunt bihajat 'iilaa 'iieadat taebiat 'iidafiat qabl maweidik altaali, yurja alaitisal bialsaydaliat awlaan. la tns ziaratan saydaliatan mujtamae musaa kawn fi markaz alqalb wal'aweiat aldamawiat fi [address] [phone] 'asear maequlat lil'adwiat almusufat wal'adwiat alati la tastalzim wasfatan tibiyatan, bial'iidafat 'iilaa khadamat mithl altateimat watawsil al'adwiat 'iilaa almanazili. marghirit bray, tabiba tibu al'usra  It was wonderful to see you today.  Please bring ALL of your medications with you to every visit.   Today we talked about:  - You can take 2 tabs of acetaminophen  (Tylenol ) up to four times a day every 6 hours for knee pain. You can also use the Diclofenac  gel four times a day.   We scheduled you a follow up appointment on Friday October 24th at 9 AM.  Thank you for choosing [facility].   Please call [phone] with any questions about today's appointment.  Please arrive at least 15 minutes prior to your scheduled appointments.   If you had blood work today, I will send you a MyChart message or a letter if results are normal. Otherwise, I will give you a call.   If you had a referral placed, they will call you to set up an appointment. Please give us  a call if you don't hear back in the next 2 weeks.   If you need additional refills before your next appointment, please call your pharmacy first.  Don't forget to check out the [facility] in the [facility] at [address] [phone] Affordable prices on prescriptions and over-the-counter items, as well as services like vaccinations and medication home delivery.   Rollene Keeling, MD  Family Medicine

## 2024-03-21 NOTE — Progress Notes (Unsigned)
    SUBJECTIVE:   CHIEF COMPLAINT / HPI:   Emily Newman is a 68 YO female presenting to clinic with the chief complaint of left knee pain and itching of the right subscapular area that has been bothering her for some time now. There is obvious swelling around the left and right kneecaps but she is only complaining of pain with weight bearing on the left knee. With the itching, there are no obvious skin changes such as redness, swelling, discoloration, but there are marks from the patient's nails.   PERTINENT  PMH / PSH: One month ago she received bilateral knee injections of depomedrol. The rest of her records were reviewed.   OBJECTIVE:   BP 138/70   Pulse 77   Ht 5' 2 (1.575 m)   Wt 135 lb (61.2 kg)   SpO2 98%   BMI 24.69 kg/m   Physical Exam Constitutional:      Appearance: Normal appearance.  HENT:     Head: Normocephalic and atraumatic.  Eyes:     Conjunctiva/sclera: Conjunctivae normal.  Cardiovascular:     Rate and Rhythm: Normal rate and regular rhythm.  Pulmonary:     Effort: Pulmonary effort is normal.     Breath sounds: Normal breath sounds.  Musculoskeletal:     Cervical back: Neck supple.     Comments: Swelling on both knees.  Skin:        Comments: Scratch marks under right scapula   Neurological:     Mental Status: She is alert and oriented to person, place, and time.  Psychiatric:        Mood and Affect: Mood normal.        Behavior: Behavior normal.        Thought Content: Thought content normal.        Judgment: Judgment normal.      ASSESSMENT/PLAN:   Assessment & Plan Chronic pain of left knee Meds ordered this encounter  Medications   diclofenac  Sodium (VOLTAREN ) 1 % GEL    Sig: Apply four times daily to knee    Dispense:  350 g    Refill:  2   acetaminophen  (TYLENOL ) 500 MG tablet    Sig: Take 2 tablets (1,000 mg total) by mouth every 6 (six) hours as needed.    Dispense:  90 tablet    Refill:  2   Patient was educated on how to  administer Voltaren  Gel for the knee pain and also to take Tylenol  as needed.    Essential hypertension  On repeat blood pressure measurement, patient's values were in range with 128/70. She is taking her amlodipine  as prescribed.   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 She was reassured that it is not a concern and that her itching will get better over time.   Encounter for immunization Orders Placed This Encounter  Procedures   Flu vaccine HIGH DOSE PF(Fluzone Trivalent)   Basic Metabolic Panel  Flu vaccine administered at visit.   Chronic GERD  GERD is well controlled on Omeprazole .       Rollene FORBES Keeling, MD [facility]

## 2024-03-21 NOTE — Assessment & Plan Note (Signed)
  On repeat blood pressure measurement, patient's values were in range with 128/70. She is taking her amlodipine  as prescribed. BMP today to update labs.

## 2024-03-22 LAB — BASIC METABOLIC PANEL WITH GFR
BUN/Creatinine Ratio: 11 — ABNORMAL LOW (ref 12–28)
BUN: 8 mg/dL (ref 8–27)
CO2: 21 mmol/L (ref 20–29)
Calcium: 9.1 mg/dL (ref 8.7–10.3)
Chloride: 104 mmol/L (ref 96–106)
Creatinine, Ser: 0.76 mg/dL (ref 0.57–1.00)
Glucose: 89 mg/dL (ref 70–99)
Potassium: 3.8 mmol/L (ref 3.5–5.2)
Sodium: 140 mmol/L (ref 134–144)
eGFR: 85 mL/min/1.73 (ref >59)

## 2024-03-30 NOTE — Progress Notes (Signed)
   03/30/24 2200  Spiritual Encounters  Type of Visit Follow up    Chaplain made follow up visit. She shared some of the concerns about her grandchildren who are recently in foster care. Chaplain reflectively listened to her concerns, asked open ended questions and provided emotional support.   She speaks [place]. Chaplain used [place] interpreter for communication.     M.Kubra Susanna Kerry Resident [phone]

## 2024-04-04 NOTE — Congregational Nurse Program (Signed)
  Dept: [phone]   Congregational Nurse Program Note  Date of Encounter: 04/04/2024  Past Medical History: Past Medical History:  Diagnosis Date   GERD (gastroesophageal reflux disease)    Hypertension    Vision impairment     Encounter Details:  Community Questionnaire - 04/04/24 1130       Questionnaire   Ask client: Do you give verbal consent for me to treat you today? Yes    Student Assistance [employer]WEI   Ezra Person, EMT   Location Patient Served  [facility]    Encounter Setting CN site    Population Status Migrant/Refugee    Insurance Medicaid    Insurance/Financial Assistance Referral N/A    Medication Have Medication Insecurities    Medical Provider Yes    Screening Referrals Made N/A    Medical Referrals Made N/A   Chaplain   Medical Appointment Completed [facility]    CNP Interventions Advocate/Support;Navigate Healthcare System;Case Management;Counsel;Educate    Screenings CN Performed Blood Pressure    ED Visit Averted N/A    Life-Saving Intervention Made N/A         Patient came in requesting details regarding her upcoming appointment. Information provided.   Naomie Jermarcus Mcfadyen RN BSN PCCN  [facility] [phone]-cell [phone]-office

## 2024-04-20 NOTE — Progress Notes (Addendum)
" ° ° °  SUBJECTIVE:   CHIEF COMPLAINT: HTN HPI:   Emily Newman is a 68 y.o.  with history notable for HTN and OA of the knee presenting for follow up. The patient speaks Dinka and Arleene as their primary language.  An interpreter was used for the entire visit.  SABRA   Discussed the use of AI scribe software for clinical note transcription with the patient, who gave verbal consent to proceed.  History of Present Illness Emily Newman is a 68 year old female who presents with knee pain and swelling.  Bilateral knee pain and swelling - Pain and swelling present in both knees - Steroid injections administered two months ago provided some relief, especially in the right knee - Persistent pain in the left knee despite prior intervention - Applies topical gel to knees twice daily, limited by school schedule - No recent falls - Difficulty ambulating due to knee symptoms - No use of other medications for knee pain  Hypertension - Takes amlodipine  and olmesartan  for blood pressure control      PERTINENT  PMH / PSH/Family/Social History : living with Achol who is back in school   Amenable to a thyroid  ultrasound   OBJECTIVE:   BP 135/80   Pulse 85   Ht 5' 2 (1.575 m)   Wt 136 lb (61.7 kg)   SpO2 100%   BMI 24.87 kg/m   Today's weight:  Last Weight  Most recent update: 04/21/2024  9:24 AM    Weight  61.7 kg (136 lb)            Review of prior weights: [employer]   04/21/24 0920  Weight: 136 lb (61.7 kg)   RRR Lungs clear bilaterally Bilateral knees with + medial joint line tenderness R> L + crepitus   ASSESSMENT/PLAN:   Assessment & Plan Essential hypertension At goal Continue current medications  Osteoarthritis of both knees, unspecified osteoarthritis type Discussed options Continue Voltaren  Gel Had relief of 1 month with injection Will refer to Orthopedics--I do wonder if she is need of arthroplasty vs. HA injections  Aneurysm of ascending aorta without  rupture Discussed, she would like to wait on further testing due to time right now, CTA at follow up if agreeable   Liver cyst Discussed, will continue to address, needs MRI for this, will discuss more at follow up Thyroid  nodule Agreeable to thyroid  ultrasound, follow up scheduled   At next visit discuss liver MRI, CTA, and obtain lipids   Suzann Daring, MD  Family Medicine Teaching Service  [facility]   "

## 2024-04-21 ENCOUNTER — Ambulatory Visit (INDEPENDENT_AMBULATORY_CARE_PROVIDER_SITE_OTHER): Admitting: Family Medicine

## 2024-04-21 ENCOUNTER — Encounter: Payer: Self-pay | Admitting: Family Medicine

## 2024-04-21 VITALS — BP 135/80 | HR 85 | Ht 62.0 in | Wt 136.0 lb

## 2024-04-21 DIAGNOSIS — E041 Nontoxic single thyroid nodule: Secondary | ICD-10-CM

## 2024-04-21 DIAGNOSIS — M170 Bilateral primary osteoarthritis of knee: Secondary | ICD-10-CM

## 2024-04-21 DIAGNOSIS — H547 Unspecified visual loss: Secondary | ICD-10-CM

## 2024-04-21 DIAGNOSIS — I7121 Aneurysm of the ascending aorta, without rupture: Secondary | ICD-10-CM | POA: Diagnosis not present

## 2024-04-21 DIAGNOSIS — I10 Essential (primary) hypertension: Secondary | ICD-10-CM | POA: Diagnosis present

## 2024-04-21 DIAGNOSIS — K7689 Other specified diseases of liver: Secondary | ICD-10-CM

## 2024-04-21 MED ORDER — AMLODIPINE-OLMESARTAN 5-20 MG PO TABS: 2.0000 | ORAL_TABLET | Freq: Every day | ORAL | 3 refills | Status: DC

## 2024-04-21 NOTE — Patient Instructions (Addendum)
 It was wonderful to see you today.  Please bring ALL of your medications with you to every visit.   Today we talked about:  -- Getting a thyroid  ultrasound  --You need a Liver MRI--you will be called about this   -- You had a dilated artery on your CT scan, you need a CAT scan for this, you will also be called about this    -- I have referred you to Knee Doctor to further evaluate your concern. If you have not received a phone call about this appointment within 3-4 weeks, please call our office back at [phone]. Margit Dimes coordinates our referrals and can assist you in this.   Please follow up in 2 months   Thank you for choosing [facility].   Please call [phone] with any questions about today's appointment.  Please be sure to schedule follow up at the front  desk before you leave today.   Suzann Daring, MD  Family Medicine

## 2024-04-21 NOTE — Assessment & Plan Note (Signed)
 At goal. Continue current medications.

## 2024-04-21 NOTE — Assessment & Plan Note (Signed)
 Agreeable to thyroid  ultrasound, follow up scheduled
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
## 2024-04-26 ENCOUNTER — Ambulatory Visit (HOSPITAL_COMMUNITY)
Admission: RE | Admit: 2024-04-26 | Discharge: 2024-04-26 | Disposition: A | Discharge status: Home / Self Care | Source: Ambulatory Visit | Attending: Family Medicine | Admitting: Family Medicine

## 2024-04-26 DIAGNOSIS — E041 Nontoxic single thyroid nodule: Secondary | ICD-10-CM | POA: Diagnosis present

## 2024-04-28 ENCOUNTER — Ambulatory Visit: Payer: Self-pay | Admitting: Family Medicine

## 2024-04-28 ENCOUNTER — Ambulatory Visit

## 2024-04-28 DIAGNOSIS — E041 Nontoxic single thyroid nodule: Secondary | ICD-10-CM

## 2024-04-28 NOTE — Telephone Encounter (Signed)
 Follow up 1 year thyroid  ultrasound.

## 2024-05-16 NOTE — Progress Notes (Deleted)
    SUBJECTIVE:   CHIEF COMPLAINT: follow up HPI:   Emily Newman is a 68 y.o.  with history notable for HTN, OA of knee, aneurysm of ascending aorta, and liver cysts presenting for follow up.   The patient speaks Dinka and Arabic  as their primary language.  An interpreter was used for the entire visit.   Discussed the use of AI scribe software for clinical note transcription with the patient, who gave verbal consent to proceed.  History of Present Illness      PERTINENT  PMH / PSH/Family/Social History : HTN, OA  CT Chest 2024 1. Occasional areas of subpleural ground-glass and slight reticulation in the lower lung zones, may represent scarring or early interstitial lung disease. 2. A 5 mm right lower lobe pulmonary nodule. Per Fleischner Society Guidelines, no routine follow-up imaging is recommended. These guidelines do not apply to immunocompromised patients and patients with cancer. Follow up in patients with significant comorbidities as clinically warranted. For lung cancer screening, adhere to Lung-RADS guidelines. Reference: Radiology. 2017; 284(1):228-43. 3. Multiple small mediastinal and hilar lymph nodes, some of which demonstrate increased density and are likely partially calcified. This is nonspecific but can be seen with sarcoidosis or sequela prior granulomatous disease. 4. Mild aneurysmal dilatation of the ascending aorta, 4 cm, although not well assessed in the absence of IV contrast. Recommend annual imaging followup by CTA or MRA. This recommendation follows 2010 ACCF/AHA/AATS/ACR/ASA/SCA/SCAI/SIR/STS/SVM Guidelines for the Diagnosis and Management of Patients with Thoracic Aortic Disease. Circulation. 2010; 121: Z733-z630. Aortic aneurysm NOS (ICD10-I71.9) 5. Moderate-sized hiatal hernia. 6. Heterogeneously enlarged thyroid  gland, enlarged left lobe of the thyroid  causes rightward tracheal deviation. This has been evaluated on previous imaging. 7.  Numerous low-density lesions in the liver measures simple fluid density and are consistent with cysts, however incompletely characterized in the absence of IV contrast. Additionally, within the right hepatic lobe is a 17 mm determinant lesion that complex cyst but is nonspecific on the current exam. Consider more detailed assessment with hepatic MRI as clinically indicated.     OBJECTIVE:   There were no vitals taken for this visit.  Today's weight:  Review of prior weights: There were no vitals filed for this visit.   Cardiac: Regular rate and rhythm. Normal S1/S2. No murmurs, rubs, or gallops appreciated. Lungs: Clear bilaterally to ascultation.  Abdomen: Normoactive bowel sounds. No tenderness to deep or light palpation. No rebound or guarding.  ***  Psych: Pleasant and appropriate    ASSESSMENT/PLAN:   Assessment & Plan Essential hypertension At goal <130/80  On amlodipine  olmesartan  5-20  Thyroid  nodule Recent imaging appropriate, follow up 1 year Dyslipidemia Lipid panel today, discussed lifestyle changes Liver cyst Discussed and ordered MRI     Suzann Daring, MD  Family Medicine Teaching Service  [facility]

## 2024-05-16 NOTE — Assessment & Plan Note (Deleted)
 At goal <130/80  On amlodipine  olmesartan  5-20

## 2024-05-16 NOTE — Assessment & Plan Note (Deleted)
 Recent imaging appropriate, follow up 1 year

## 2024-05-17 NOTE — Congregational Nurse Program (Signed)
  Dept: [phone]   Congregational Nurse Program Note  Date of Encounter: 05/17/2024  Past Medical History: Past Medical History:  Diagnosis Date   GERD (gastroesophageal reflux disease)    Hypertension    Vision impairment     Encounter Details:  Community Questionnaire - 05/17/24 1144       Questionnaire   Ask client: Do you give verbal consent for me to treat you today? Yes    Student Assistance [employer]    [place] Served  [place]    Encounter Setting CN site    Population Status Migrant/Refugee    Insurance Medicaid    Insurance/Financial Assistance Referral [facility]    Medication Have Medication Insecurities;Patient Medications Reviewed    Medical Provider Yes    Screening Referrals Made N/A    Medical Referrals Made [facility]    Medical Appointment Completed [facility]    CNP Interventions Advocate/Support;Navigate Healthcare System;Case Management;Counsel;Educate    Screenings CN Performed Blood Pressure    ED Visit Averted N/A    Life-Saving Intervention Made N/A         Patient came in distress crying because her granddaughter was taken away to a foster home. She reported that her granddaughter ran away from her foster home again and she does not know where she is. I called her case manger Ms.Amy she stated that the child was picked up by someone and the police are aware of it and they are looking for her. Ms.Haely was reassured by the resident chaplain and myself that if any information comes that she will be informed.    Naomie Jaye Polidori RN BSN PCCN  [facility] [phone]-office

## 2024-05-19 ENCOUNTER — Ambulatory Visit: Admitting: Family Medicine

## 2024-05-19 DIAGNOSIS — K7689 Other specified diseases of liver: Secondary | ICD-10-CM

## 2024-05-19 DIAGNOSIS — I10 Essential (primary) hypertension: Secondary | ICD-10-CM

## 2024-05-19 DIAGNOSIS — E785 Hyperlipidemia, unspecified: Secondary | ICD-10-CM

## 2024-05-19 DIAGNOSIS — E041 Nontoxic single thyroid nodule: Secondary | ICD-10-CM

## 2024-05-30 ENCOUNTER — Ambulatory Visit: Admitting: Orthopaedic Surgery

## 2024-05-30 ENCOUNTER — Encounter: Payer: Self-pay | Admitting: Family Medicine

## 2024-05-30 ENCOUNTER — Ambulatory Visit: Admitting: Family Medicine

## 2024-05-30 VITALS — BP 130/54 | HR 89 | Ht 62.0 in | Wt 137.2 lb

## 2024-05-30 DIAGNOSIS — E782 Mixed hyperlipidemia: Secondary | ICD-10-CM | POA: Diagnosis not present

## 2024-05-30 DIAGNOSIS — R053 Chronic cough: Secondary | ICD-10-CM

## 2024-05-30 DIAGNOSIS — I10 Essential (primary) hypertension: Secondary | ICD-10-CM | POA: Diagnosis present

## 2024-05-30 MED ORDER — OMEPRAZOLE 40 MG PO CPDR: 40.0000 mg | DELAYED_RELEASE_CAPSULE | Freq: Every day | ORAL | 3 refills | Status: AC

## 2024-05-30 MED ORDER — AMLODIPINE-OLMESARTAN 5-20 MG PO TABS: 2.0000 | ORAL_TABLET | Freq: Every day | ORAL | 3 refills | Status: AC

## 2024-05-30 NOTE — Patient Instructions (Addendum)
????? ?????? ?????! ????? ???????? ????? ??? ???? ??????? ??????? ?????? ???????. ?? ??? ??? ????? ???? ??? ???? ?????? ??? ??????.  ?????? ??????? ?? ???:   1. ????? ??? ???? ?????????/??????????? ?-?? ???? ????? ??????? ??????????? ?? ??? ??????. 2. ??? ????? ????? ??????? ???? ????? ?????? ?????? ???? ??????? ???? ???? ?????. 3. ??? ????? ??????? ???????? ??? ??????? ?????? ???? ? ????? ?????? ?? ??????? ???????? ????????.  ????? ????? ??? ???????? ????????. ??? ???? ??????? ??? ??????? ?????? ??. ??? ???? ??????? ?????? ?? ????? ??? MyChart (??? ??? ?????) ?? ????? ???????. ??? ?? ?????? ?? ????? ?? ????? ??????? ???????? ???? ????????? ????????? ????? ??????? ????????.  ??? ???? ?????? ??? ???????. ?? ???? ?????? ?? ?????.  ???? ?????? ??? ?? ????? ?? ????? ????? ????? ????? ????? ?????? ??????. ????? ????? ?? ??? ?????.  ????? ??? ??? ????? ?????? ?? ???????? ?? ???????? ??????? ?????? ??????? ?? ?? ?????? ??/?/????? ??:?? ?????? PGY-2? ?? ?????? ?? [place]  It was great to see you today! Thank you for choosing [facility] for your primary care. Khalidah Manheim was seen for BP check and refill.  Today we addressed: Refills for Amlodipine /Olmesartan  5-20 mg 2 tablets daily and Omeprazole  40 mg daily Declined colonoscopy and bone density screening today which are recommended screenings for your age. Thyroid  US  yearly for 5 years for stability of calcified nodule  We are checking some labs today. If they are abnormal, I will call you. If they are normal, I will send you a MyChart message (if it is active) or a letter in the mail. If you do not hear about your labs in the next 2 weeks, please call the office.  You should return to our clinic No follow-ups on file. Please arrive 15 minutes before your appointment to ensure smooth check in process.  We appreciate your efforts in making this happen.  Thank you for allowing me to participate in your care, Kathrine Melena,  DO 05/30/2024, 10:54 AM PGY-2, [facility]

## 2024-05-30 NOTE — Assessment & Plan Note (Signed)
 Patient's blood pressure is controlled today. BP: (!) 130/54. Goal of 130/80. Patient's medication regimen includes Amlodipine /Olmesartan  5-20 mg (2 tabs), refilled today.

## 2024-05-30 NOTE — Progress Notes (Signed)
    SUBJECTIVE:   CHIEF COMPLAINT / HPI:   Medication Refills Patient would like a refill of her blood pressure medication and omeprazole .  Healthcare maintenance Discussed the importance of screening test such as colonoscopy and DEXA scan, patient refused.  Would advise continued discussions regarding this.  Needs follow-up on thyroid  nodule October 2026.  Needs follow-up regarding mild aneurysm of ascending aorta annually with CTA or MRA and right hepatic lobe lesion which may need hepatic MRI if indicated.  PERTINENT  PMH / PSH: GERD, HTN, calcified thyroid  nodule, lung nodule with multiple small mediastinal and hilar lymph nodes, mild aneurysm of the ascending aorta (4 cm), right hepatic lobe lesion  OBJECTIVE:   BP (!) 130/54   Pulse 89   Ht 5' 2 (1.575 m)   Wt 137 lb 3.2 oz (62.2 kg)   SpO2 100%   BMI 25.09 kg/m   General: Awake and Alert in NAD HEENT: NCAT. Sclera anicteric. No rhinorrhea. Cardiovascular: RRR. No M/R/G Respiratory: CTAB, normal WOB on RA. No wheezing, crackles, rhonchi, or diminished breath sounds. Extremities: Able to move all extremities. No BLE edema, no deformities or significant joint findings. Skin: Warm and dry.  ASSESSMENT/PLAN:   Assessment & Plan Essential hypertension Patient's blood pressure is controlled today. BP: (!) 130/54. Goal of 130/80. Patient's medication regimen includes Amlodipine /Olmesartan  5-20 mg (2 tabs), refilled today. Elevated cholesterol with elevated triglycerides Lipid panel overdue, last done in 2022.  Elevated triglycerides and LDL noted in 2022.  Not on statin. - Lipid panel ordered   Kathrine Melena, DO [facility]

## 2024-05-31 ENCOUNTER — Ambulatory Visit: Payer: Self-pay | Admitting: Family Medicine

## 2024-05-31 LAB — LIPID PANEL
Chol/HDL Ratio: 3.3 ratio (ref 0.0–4.4)
Cholesterol, Total: 190 mg/dL (ref 100–199)
HDL: 57 mg/dL (ref >39)
LDL Chol Calc (NIH): 117 mg/dL — ABNORMAL HIGH (ref 0–99)
Triglycerides: 91 mg/dL (ref 0–149)
VLDL Cholesterol Cal: 16 mg/dL (ref 5–40)

## 2024-06-13 NOTE — Congregational Nurse Program (Signed)
°  Dept: [phone]   Congregational Nurse Program Note  Date of Encounter: 06/13/2024  Past Medical History: Past Medical History:  Diagnosis Date   GERD (gastroesophageal reflux disease)    Hypertension    Vision impairment     Encounter Details:  Community Questionnaire - 06/13/24 1221       Questionnaire   Ask client: Do you give verbal consent for me to treat you today? Yes    Student Assistance Medical Student    Location Patient Served  [facility]    Encounter Setting CN site    Population Status Migrant/Refugee    Insurance Medicaid    Insurance/Financial Assistance Referral N/A    Medication Have Medication Insecurities    Medical Provider Yes    Screening Referrals Made N/A    Medical Referrals Made [facility]    Medical Appointment Completed [facility]    CNP Interventions Advocate/Support;Navigate Healthcare System;Case Management;Counsel;Educate    Screenings CN Performed Blood Pressure    ED Visit Averted N/A    Life-Saving Intervention Made N/A         Patient came in to see congregational RN requesting appointment for knee pain. Appointment scheduled at 06/19/24 at 11:10 AM with [facility]. Blood pressure today is within normal limits. Patient encouraged to stay compliant with medication.   Naomie Empress Newmann RN BSN PCCN  [facility] [phone]-office

## 2024-06-14 NOTE — Progress Notes (Deleted)
° ° °  SUBJECTIVE:   CHIEF COMPLAINT / HPI:   Knee pain   PERTINENT  PMH / PSH: ***  OBJECTIVE:   There were no vitals taken for this visit. ***  General: NAD, pleasant, able to participate in exam Cardiac: RRR, no murmurs. Respiratory: CTAB, normal effort, No wheezes, rales or rhonchi Abdomen: Bowel sounds present, nontender, nondistended Extremities: no edema or cyanosis. Skin: warm and dry, no rashes noted Neuro: alert, no obvious focal deficits Psych: Normal affect and mood  ASSESSMENT/PLAN:   No problem-specific Assessment & Plan notes found for this encounter.     Dr. Izetta Nap, DO [facility]    {    This will disappear when note is signed, click to select method of visit    :1}

## 2024-06-19 ENCOUNTER — Ambulatory Visit: Payer: Self-pay | Admitting: Family Medicine

## 2024-06-27 ENCOUNTER — Ambulatory Visit: Admitting: Family Medicine

## 2024-07-11 NOTE — Congregational Nurse Program (Signed)
" °  Dept: [phone]   Congregational Nurse Program Note  Date of Encounter: 07/11/2024  Past Medical History: Past Medical History:  Diagnosis Date   GERD (gastroesophageal reflux disease)    Hypertension    Vision impairment     Encounter Details:  Community Questionnaire - 07/11/24 1215       Questionnaire   Ask client: Do you give verbal consent for me to treat you today? Yes    Student Assistance Medical Student    Location Patient Served  [facility]    Encounter Setting CN site    Insurance Medicaid    Insurance/Financial Assistance Referral N/A    Medication Have Medication Insecurities    Medical Provider Yes    Medical Referrals Made [facility]    Medical Appointment Completed [facility]    Screenings CN Performed (remember to also record results) NA    CNP Interventions Advocate/Support;Case Management    ED Visit Averted N/A         Ms Emily Newman came in requesting assistance to call and talk with Ms Emily Newman [phone] social worker to her granddaughter. Ms Emily Newman would like to know the whereabouts of her granddaughter. Last time she spoke with Ms Newman, it was reported that her granddaughter had ran away from her foster home. It has been a month and Ms Emily Newman is worried about her. Voice message left for Ms Emily Newman to call me back.  Emily Miakoda Mcmillion RN BSN PCCN  [facility] [phone]-cell [phone]-office    "

## 2024-08-18 ENCOUNTER — Ambulatory Visit: Admitting: Family Medicine
